# Patient Record
Sex: Female | Born: 1980 | Race: White | Hispanic: No | Smoking: Never smoker
Health system: Southern US, Community
[De-identification: ages and names within clinical notes are randomized; demographics above are authoritative.]

## PROBLEM LIST (undated history)

## (undated) DIAGNOSIS — F32A Depression, unspecified: Secondary | ICD-10-CM

## (undated) DIAGNOSIS — M1990 Unspecified osteoarthritis, unspecified site: Secondary | ICD-10-CM

## (undated) DIAGNOSIS — F329 Major depressive disorder, single episode, unspecified: Secondary | ICD-10-CM

## (undated) DIAGNOSIS — M533 Sacrococcygeal disorders, not elsewhere classified: Secondary | ICD-10-CM

## (undated) DIAGNOSIS — M5136 Other intervertebral disc degeneration, lumbar region: Secondary | ICD-10-CM

## (undated) DIAGNOSIS — J302 Other seasonal allergic rhinitis: Secondary | ICD-10-CM

## (undated) DIAGNOSIS — R32 Unspecified urinary incontinence: Secondary | ICD-10-CM

## (undated) DIAGNOSIS — R9431 Abnormal electrocardiogram [ECG] [EKG]: Secondary | ICD-10-CM

## (undated) DIAGNOSIS — M51369 Other intervertebral disc degeneration, lumbar region without mention of lumbar back pain or lower extremity pain: Secondary | ICD-10-CM

## (undated) DIAGNOSIS — F419 Anxiety disorder, unspecified: Secondary | ICD-10-CM

## (undated) DIAGNOSIS — Z9889 Other specified postprocedural states: Secondary | ICD-10-CM

## (undated) DIAGNOSIS — Z8742 Personal history of other diseases of the female genital tract: Secondary | ICD-10-CM

## (undated) HISTORY — DX: Sacrococcygeal disorders, not elsewhere classified: M53.3

## (undated) HISTORY — DX: Depression, unspecified: F32.A

## (undated) HISTORY — DX: Personal history of other diseases of the female genital tract: Z98.890

## (undated) HISTORY — DX: Unspecified urinary incontinence: R32

## (undated) HISTORY — DX: Other seasonal allergic rhinitis: J30.2

## (undated) HISTORY — DX: Unspecified osteoarthritis, unspecified site: M19.90

## (undated) HISTORY — DX: Major depressive disorder, single episode, unspecified: F32.9

## (undated) HISTORY — DX: Other specified postprocedural states: Z87.42

## (undated) HISTORY — DX: Abnormal electrocardiogram (ECG) (EKG): R94.31

## (undated) HISTORY — DX: Other intervertebral disc degeneration, lumbar region: M51.36

## (undated) HISTORY — DX: Other intervertebral disc degeneration, lumbar region without mention of lumbar back pain or lower extremity pain: M51.369

## (undated) HISTORY — DX: Anxiety disorder, unspecified: F41.9

---

## 1999-01-23 ENCOUNTER — Ambulatory Visit (HOSPITAL_COMMUNITY): Admission: RE | Admit: 1999-01-23 | Discharge: 1999-01-23 | Payer: Self-pay | Admitting: *Deleted

## 1999-01-23 ENCOUNTER — Encounter: Payer: Self-pay | Admitting: *Deleted

## 2001-03-15 HISTORY — PX: OTHER SURGICAL HISTORY: SHX169

## 2001-12-31 ENCOUNTER — Ambulatory Visit (HOSPITAL_COMMUNITY): Admission: RE | Admit: 2001-12-31 | Discharge: 2001-12-31 | Payer: Self-pay | Admitting: Orthopedic Surgery

## 2001-12-31 ENCOUNTER — Encounter: Payer: Self-pay | Admitting: Orthopedic Surgery

## 2002-08-29 ENCOUNTER — Encounter: Admission: RE | Admit: 2002-08-29 | Discharge: 2002-09-18 | Payer: Self-pay | Admitting: Orthopedic Surgery

## 2003-10-14 ENCOUNTER — Ambulatory Visit (HOSPITAL_BASED_OUTPATIENT_CLINIC_OR_DEPARTMENT_OTHER): Admission: RE | Admit: 2003-10-14 | Discharge: 2003-10-14 | Payer: Self-pay | Admitting: Orthopedic Surgery

## 2003-10-30 ENCOUNTER — Encounter: Admission: RE | Admit: 2003-10-30 | Discharge: 2004-01-28 | Payer: Self-pay | Admitting: Orthopedic Surgery

## 2005-03-05 ENCOUNTER — Ambulatory Visit (HOSPITAL_COMMUNITY): Admission: RE | Admit: 2005-03-05 | Discharge: 2005-03-05 | Payer: Self-pay | Admitting: Family Medicine

## 2005-03-09 ENCOUNTER — Ambulatory Visit: Payer: Self-pay | Admitting: Internal Medicine

## 2006-01-11 ENCOUNTER — Emergency Department: Payer: Self-pay | Admitting: Unknown Physician Specialty

## 2006-09-14 ENCOUNTER — Inpatient Hospital Stay: Payer: Self-pay

## 2006-09-20 ENCOUNTER — Ambulatory Visit: Payer: Self-pay

## 2006-11-13 ENCOUNTER — Emergency Department (HOSPITAL_COMMUNITY): Admission: EM | Admit: 2006-11-13 | Discharge: 2006-11-13 | Payer: Self-pay | Admitting: Emergency Medicine

## 2010-07-31 NOTE — Op Note (Signed)
NAME:  [name], [name] [facility]                 ACCOUNT NO.:  [id number]   MEDICAL RECORD NO.:  [id number]                   PATIENT TYPE:  AMB   LOCATION:  DSC                                  FACILITY:  [facility]   PHYSICIAN:  Robert A. Thurston Hole, M.D.              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   PROCEDURES:  1. Left knee examination under anesthesia, followed by arthroscopic     chondroplasty.  2. Left knee lateral release.  3. Left knee partial synovectomy.   SURGEON:  Elana Alm. Thurston Hole, M.D.   ASSISTANT:  Julien Girt, P.A.   ANESTHESIA:  General.   OPERATIVE TIME:  40 minutes.   COMPLICATIONS:  None.   INDICATION FOR PROCEDURE:  Ms. Miranda Morales is a 30 year old woman who has had  significant pain in her left knee for the past two to three years,  increasing in nature, with the exam and MRI documenting chondromalacia,  synovitis, with lateral patellar tracking, who has failed conservative care  and is now to undergo arthroscopy.   DESCRIPTION:  Ms. Miranda Morales was brought to the operating room on October 14, 2003, placed on the operative table in supine position.  After an adequate  level of general anesthesia was obtained, her left knee was examined under  anesthesia.  She had full range of motion and her knee was stable to  ligamentous exam with mild lateral patellar tracking noted.  The knee was  sterilely injected with 0.25% Marcaine with epinephrine.  The left leg was  prepped using sterile Duraprep and draped using sterile technique.  Originally through an anterolateral portal the arthroscope with a pump  attached was placed and through an  anteromedial portal an arthroscopic probe  was placed.  On initial inspection of the medial compartment, she had 25%  grade 3 chondromalacia of the medial femoral condyle, which was debrided.  The medial tibial plateau and the rest of the medial femoral condyle  articular cartilage was normal, medial meniscus normal.  ACL, PCL normal.  Lateral compartment articular cartilage normal.  Lateral meniscus normal.  The patellofemoral joint showed grade 2 and 20% grade 3 chondromalacia,  which was debrided.  The patella showed moderate tightness and lateral  patellar tracking.  Significant synovitis in the medial and lateral gutters  was debrided, otherwise they were free of pathology.  A lateral retinacular  release was performed using an ArthroCare wand under arthroscopic  visualization with no significant bleeding.  This significantly decompressed  the patellofemoral joint and improved patellar tracking to normal.  After  this was done, no further pathology was noted.  At this point it was felt  that all pathology had been satisfactorily  addressed.  The instruments were  removed.  Portals were closed with 3-0 nylon suture and injected with 0.25%  Marcaine with epinephrine and 4 mg of morphine.  A sterile dressing was  applied and the patient was awakened and taken to the recovery room in  stable condition.   FOLLOW-UP CARE:  Ms. Miranda Morales will be followed as an outpatient on Vicodin  and Naprosyn.  See her back in the office in a week for sutures out and  follow-up.                                               Robert A. Thurston Hole, M.D.    RAW/MEDQ  D:  10/14/2003  T:  10/14/2003  Job:  [id number]

## 2015-04-17 ENCOUNTER — Ambulatory Visit: Payer: BLUE CROSS/BLUE SHIELD | Attending: Orthopedic Surgery

## 2015-04-17 DIAGNOSIS — M25652 Stiffness of left hip, not elsewhere classified: Secondary | ICD-10-CM

## 2015-04-17 DIAGNOSIS — R6889 Other general symptoms and signs: Secondary | ICD-10-CM | POA: Diagnosis present

## 2015-04-17 DIAGNOSIS — M25552 Pain in left hip: Secondary | ICD-10-CM | POA: Diagnosis present

## 2015-04-17 NOTE — Patient Instructions (Signed)
Hip Flexor Stretch    Lying on back near edge of bed, bend one leg, foot flat. Hang other leg over edge, relaxed, thigh resting entirely on bed for 20 seconds. Repeat _3___ times. Do __3__ sessions per day. Advanced Exercise: Bend knee back keeping thigh in contact with bed.  http://gt2.exer.us/347   Copyright  VHI. All rights reserved.  Butterfly, Supine    Lie on back, feet together. Lower knees toward floor. Hold _20__ seconds. Repeat _3__ times per session. Do _3__ sessions per day.  Copyright  VHI. All rights reserved.  WALKING  Walking is a great form of exercise to increase your strength, endurance and overall fitness.  A walking program can help you start slowly and gradually build endurance as you go.  Everyone's ability is different, so each person's starting point will be different.  You do not have to follow them exactly.  The are just samples. You should simply find out what's right for you and stick to that program.   In the beginning, you'll start off walking 2-3 times a day for short distances.  As you get stronger, you'll be walking further at just 1-2 times per day.  A. You Can Walk For A Certain Length Of Time Each Day    Walk 5 minutes 3 times per day.  Increase 2 minutes every 2 days (3 times per day).  Work up to 25-30 minutes (1-2 times per day).   Example:   Day 1-2 5 minutes 3 times per day   Day 7-8 12 minutes 2-3 times per day   Day 13-14 25 minutes 1-2 times per day  B. You Can Walk For a Certain Distance Each Day     Distance can be substituted for time.    Example:   3 trips to mailbox (at road)   3 trips to corner of block   3 trips around the block  C. Go to local high school and use the track.    Walk for distance ____ around track  Or time ____ minutes  D. Walk ____ Jog ____ Run ___  Please only do the exercises that your therapist has initialed and dated  [facility] [address] [place], [place]  [address] Phone # [phone] Fax [phone]

## 2015-04-17 NOTE — Therapy (Signed)
[facility] [address] [place], [place], [address] Phone: [phone]   Fax:  [phone]  Physical Therapy Treatment  Patient Details  Name: Miranda Morales MRN: [id number] Date of Birth: 28-Feb-1981 Referring Provider: Salvatore Marvel, MD  Encounter Date: 04/17/2015      PT End of Session - 04/17/15 1307    Visit Number 1   Date for PT Re-Evaluation 06/12/15   PT Start Time 1232   PT Stop Time 1308   PT Time Calculation (min) 36 min   Activity Tolerance Patient tolerated treatment well   Behavior During Therapy [facility] for tasks assessed/performed      History reviewed. No pertinent past medical history.  Past Surgical History  Procedure Laterality Date  . Arthroscopic knee Right 2003    There were no vitals filed for this visit.  Visit Diagnosis:  Hip pain, acute, left  Activity intolerance  Hip stiffness, left      Subjective Assessment - 04/17/15 1232    Subjective Pt presents to PT with Lt hip pain of a chronic nature.  Pt reports that pain began ~3 years ago when she began a more regular exercise program.  Pt reports that pain has been intermittent since that time.  Over the past 8 months pain has become more severe where she cant walk due to pain.     Pertinent History pt reports broken sacrum   Limitations Walking;Standing   How long can you stand comfortably? stabbing pain reported with housework   How long can you walk comfortably? 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 Yes   Pain Score 3   with moving the wrong way. It was severe 2 weeks ago up to 9/10   Pain Location Hip   Pain Orientation Left   Pain Descriptors / Indicators Stabbing   Pain Type Chronic pain   Pain Onset More than a month ago   Pain Frequency Intermittent   Aggravating  Factors  standing, walking, moving the wrong way   Pain Relieving Factors meloxicam, nothing else helps            [facility] PT Assessment - 04/17/15 0001    Assessment   Medical Diagnosis primary localized OA of Lt hip   Referring Provider Salvatore Marvel, MD   Onset Date/Surgical Date 04/16/12   Next MD Visit none   Prior Therapy 3 years ago, nothing recent   Precautions   Precautions None   Restrictions   Weight Bearing Restrictions No   Balance Screen   Has the patient fallen in the past 6 months No   Has the patient had a decrease in activity level because of a fear of falling?  No   Is the patient reluctant to leave their home because of a fear of falling?  No   Home Environment   Living Environment Private residence   Living Arrangements Spouse/significant other;Children   Type of Home House   Home Access --   Prior Function   Level of Independence Independent   Vocation Unemployed   Vocation Requirements Pt has children and cares for the children   Leisure likes to exercise and not able to do low impact exercises   Cognition   Overall Cognitive Status Within Functional Limits for tasks assessed   Observation/Other Assessments   Focus  on Therapeutic Outcomes (FOTO)  59% limitation   ROM / Strength   AROM / PROM / Strength AROM;PROM;Strength   AROM   Overall AROM  Deficits   AROM Assessment Site Hip   Right/Left Hip Right;Left   Right Hip External Rotation  40   Right Hip Internal Rotation  55   Left Hip External Rotation  23   Left Hip Internal Rotation  30   PROM   Overall PROM  Deficits   Overall PROM Comments hamstring length is equal and normal bilaterally.  Lt hip ER limited by 25% vs the Rt with hip pain reported   PROM Assessment Site Hip   Strength   Overall Strength Deficits   Strength Assessment Site Hip;Knee   Right/Left Hip Right;Left   Right Hip Flexion 4+/5   Right Hip Extension 4+/5   Right Hip External Rotation  5/5   Right Hip Internal  Rotation 5/5   Right Hip ABduction 4+/5   Left Hip Flexion 4-/5   Left Hip Extension 4/5   Left Hip External Rotation 4+/5   Left Hip Internal Rotation 4+/5   Left Hip ABduction 4+/5   Right/Left Knee Right;Left   Right Knee Flexion 5/5   Right Knee Extension 5/5   Left Knee Flexion 5/5   Left Knee Extension 4+/5   Palpation   Palpation comment Pt with mild palpable tenderness at the Lt groin and proximal quads.     Ambulation/Gait   Ambulation/Gait Yes   Ambulation Distance (Feet) 100 Feet   Gait Pattern Within Functional Limits                             PT Education - 04/17/15 1301    Education provided Yes   Education Details HEP: hip flexibility and walking program   Person(s) Educated Patient   Methods Explanation;Demonstration;Handout   Comprehension Verbalized understanding;Returned demonstration          PT Short Term Goals - 04/17/15 1313    PT SHORT TERM GOAL #1   Title be independent in initial HEP   Time 4   Period Weeks   Status New   PT SHORT TERM GOAL #2   Title report a 30% reduction in Lt hip pain with community ambulation   Time 4   Period Weeks   Status New   PT SHORT TERM GOAL #3   Title initiate a walking program for exercise and verbalize how to safely progress   Time 4   Period Weeks   Status New           PT Long Term Goals - 04/17/15 1226    PT LONG TERM GOAL #1   Title be independent in advanced HEP   Time 8   Period Weeks   Status New   PT LONG TERM GOAL #2   Title reduce FOTO to < or = to 42% limitation   Time 8   Period Weeks   Status New   PT LONG TERM GOAL #3   Title improve Lt hip ER AROM to allow for crossing leg to put on shoe with 50% increased ease   Time 8   Period Weeks   Status New   PT LONG TERM GOAL #4   Title report a 60% reduction in Lt hip pain with community ambulation   Time 8   Period Weeks   Status New   PT LONG TERM GOAL #5  Title perform walking for exercise regularly  without limitaiton due to Lt hip pain   Time 8   Period Weeks   Status New               Plan - 04/17/15 1308    Clinical Impression Statement Pt reports to PT with 3 year history of Lt hip pain.  Pt reports that this has significantly increased over the past 8 months.  Pt reports that prior to seeing the MD , her hip was 7/10 with walking and she has been unable to exercise.  Pt is now taking Meloxicam and steroid dose pack so her pain is now down to 3/10.  Pt demonstrates limited Lt hip ER and weakness upon strength testing.  Pt is limited in her ability to walk in the community at times due to pain.  Pt will benefit from skilled PT for Lt hip AROM, strength, manual and modalities to allow for improved community ambulation, reduced pain and return to exercise.     Pt will benefit from skilled therapeutic intervention in order to improve on the following deficits Decreased range of motion;Difficulty walking;Decreased endurance;Decreased activity tolerance;Pain;Hypomobility;Decreased strength   Rehab Potential Good   PT Frequency 2x / week   PT Duration 8 weeks   PT Treatment/Interventions ADLs/Self Care Home Management;Cryotherapy;Electrical Stimulation;Moist Heat;Therapeutic exercise;Therapeutic activities;Functional mobility training;Stair training;Gait training;Ultrasound;Patient/family education;Manual techniques;Taping;Dry needling;Passive range of motion   PT Next Visit Plan Lt hip AROM, endurance, PROM, strength, modalities   Consulted and Agree with Plan of Care Patient        Problem List There are no active problems to display for this patient.   TAKACS,KELLY, PT 04/17/2015, 1:16 PM  [facility] [address] [place], [place], [address] Phone: [phone]   Fax:  [phone]  Name: Eyleen Rawlinson MRN: [id number] Date of Birth: 1980-09-16

## 2015-04-23 ENCOUNTER — Ambulatory Visit: Payer: BLUE CROSS/BLUE SHIELD | Admitting: Physical Therapy

## 2015-04-23 ENCOUNTER — Encounter: Payer: Self-pay | Admitting: Physical Therapy

## 2015-04-23 DIAGNOSIS — R6889 Other general symptoms and signs: Secondary | ICD-10-CM

## 2015-04-23 DIAGNOSIS — M25552 Pain in left hip: Secondary | ICD-10-CM | POA: Diagnosis not present

## 2015-04-23 DIAGNOSIS — M25652 Stiffness of left hip, not elsewhere classified: Secondary | ICD-10-CM

## 2015-04-23 NOTE — Patient Instructions (Signed)
Hip Flexion / Knee Extension: Straight-Leg Raise (Eccentric)   Lie on back. Lift leg with knee straight.  Contract your pelvic floor to activate the abdominals. _10__ reps per set, _2__ sets per day,   ABDUCTION: Side-Lying (Active)   Lie on right  side, top leg straight. Raise top leg up.  same reps as above http://gtsc.exer.us/94   (Home) Extension: Hip   With support under abdomen, pelvic floor contraction. Do not hyperextend.  Same as first exercise   ADDUCTION: Side-Lying (Active)   Lie on right side, with top leg bent and in front of other leg. Lift straight lower leg up as high as possible around 2 inches. Have left leg behind since to tight http://gtsc.exer.us/129   Copyright  VHI. All rights reserved.   Piriformis (Supine)  Cross legs, right on top. Gently pull other knee toward chest until stretch is felt in buttock/hip of top leg. Hold __20__ seconds. Repeat  3  times per set. Hold for 20 sec. Do  2-3 sessions per day.  Use a towel or yoga strap   Hip Stretch  Put right ankle over left knee. Let right knee fall downward, but keep ankle in place. Feel the stretch in hip. May push down gently with hand to feel stretch. Hold  20 seconds while counting out loud. Repeat with other leg. Repeat _3 ___ times. Do _2-3.  Stretching: Piriformis   Cross left leg over other thigh and place elbow over outside of knee. Gently stretch buttock muscles by pushing bent knee across body. Hold  20____ seconds. Repeat 3____ times per set.  Do  2-3____ sessions per day.  Stretching: Piriformis (Supine)  Pull right knee toward opposite shoulder. Hold __20__ seconds. Relax. Repeat _3___ times per set.  Do  2-3____ sessions per day.  Copyright  VHI. All rights reserved.

## 2015-04-23 NOTE — Therapy (Signed)
[facility] [address] [place], [place], [address] Phone: [phone]   Fax:  [phone]  Physical Therapy Treatment  Patient Details  Name: Miranda Morales MRN: [id number] Date of Birth: 1980/07/22 Referring Provider: Salvatore Marvel, MD  Encounter Date: 04/23/2015      PT End of Session - 04/23/15 1117    Visit Number 2   Date for PT Re-Evaluation 06/12/15   PT Start Time 1100   PT Stop Time 1145   PT Time Calculation (min) 45 min   Activity Tolerance Patient tolerated treatment well   Behavior During Therapy [facility] for tasks assessed/performed      History reviewed. No pertinent past medical history.  Past Surgical History  Procedure Laterality Date  . Arthroscopic knee Right 2003    There were no vitals filed for this visit.  Visit Diagnosis:  Hip pain, acute, left  Activity intolerance  Hip stiffness, left      Subjective Assessment - 04/23/15 1106    Subjective Pt complains of Lt hip pain of cronic nature. This early morning pain was 4-5/10   Pertinent History pt reports broken sacrum,   Limitations Walking;Standing   How long can you stand comfortably? stabbing pain reported with housework   How long can you walk comfortably? 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 Yes   Pain Score 2   this am up to 4-5/10   Pain Location Hip   Pain Orientation Left   Pain Descriptors / Indicators Stabbing   Pain Type Chronic pain   Pain Onset More than a month ago   Pain Frequency Intermittent   Aggravating Factors  standing,sitting, walking, moving the wrong way   Pain Relieving Factors meloxicam, nothing else helps   Multiple Pain Sites No                         [facility] Adult PT Treatment/Exercise - 04/23/15 0001    Bed Mobility   Bed Mobility --  Eudcated pt on advantage of stationary bike over TM   Exercises   Exercises Knee/Hip;Lumbar   Lumbar Exercises: Stretches   ITB Stretch 3 reps;20 seconds  only left leg   Piriformis Stretch 3 reps;20 seconds  each leg,    Lumbar Exercises: Supine   Straight Leg Raise 20 reps  with TA activation   Lumbar Exercises: Sidelying   Hip Abduction 20 reps  each side difficult on left due to weakness and pain   Other Sidelying Lumbar Exercises 2 x 10 each leg adduction    Lumbar Exercises: Prone   Straight Leg Raise 20 reps  each leg   Knee/Hip Exercises: Aerobic   Stationary Bike L 1 pt tolerated well   Modalities   Modalities Moist Heat   Moist Heat Therapy   Number Minutes Moist Heat 15 Minutes   Moist Heat Location Hip;Lumbar Spine                PT Education - 04/23/15 1147    Education provided Yes   Education Details SLR 4 planes in lying down, piriformis stretch   Person(s) Educated Patient   Methods Explanation;Demonstration;Handout   Comprehension Verbalized understanding;Returned demonstration          PT Short Term Goals - 04/23/15 1125  PT SHORT TERM GOAL #1   Title be independent in initial HEP   Time 4   Period Weeks   Status On-going   PT SHORT TERM GOAL #2   Title report a 30% reduction in Lt hip pain with community ambulation   Time 4   Period Weeks   Status On-going   PT SHORT TERM GOAL #3   Title initiate a walking program for exercise and verbalize how to safely progress   Time 4   Period Weeks   Status On-going           PT Long Term Goals - 04/17/15 1226    PT LONG TERM GOAL #1   Title be independent in advanced HEP   Time 8   Period Weeks   Status New   PT LONG TERM GOAL #2   Title reduce FOTO to < or = to 42% limitation   Time 8   Period Weeks   Status New   PT LONG TERM GOAL #3   Title improve Lt hip ER AROM to allow for crossing leg to put on shoe with 50% increased ease   Time 8   Period  Weeks   Status New   PT LONG TERM GOAL #4   Title report a 60% reduction in Lt hip pain with community ambulation   Time 8   Period Weeks   Status New   PT LONG TERM GOAL #5   Title perform walking for exercise regularly without limitaiton due to Lt hip pain   Time 8   Period Weeks   Status New               Plan - 04/23/15 1118    Clinical Impression Statement Pt is challenged with SLR in supine due to pain and overall weakness in left hip. Pt will benefit from skilled PT to improve strength and increase activity tolerance.    Clinical Impairments Affecting Rehab Potential Per pt reports during birth in 2008 fracture of sacrum, history of Lt hip pain what significantly increased over past 17month.   PT Frequency 2x / week   PT Duration 8 weeks   PT Treatment/Interventions ADLs/Self Care Home Management;Cryotherapy;Electrical Stimulation;Moist Heat;Therapeutic exercise;Therapeutic activities;Functional mobility training;Stair training;Gait training;Ultrasound;Patient/family education;Manual techniques;Taping;Dry needling;Passive range of motion   PT Next Visit Plan Lt hip AROM, endurance, PROM, strength, modalities   Consulted and Agree with Plan of Care Patient        Problem List There are no active problems to display for this patient.   NAUMANN-HOUEGNIFIO,Alexcis Bicking PTA 04/23/2015, 11:58 AM  [facility] [address] [place], [place], [address] Phone: [phone]   Fax:  [phone]  Name: Miranda Morales MRN: [id number] Date of Birth: [birth year]

## 2015-04-24 ENCOUNTER — Ambulatory Visit: Payer: BLUE CROSS/BLUE SHIELD

## 2015-04-24 VITALS — BP 119/78

## 2015-04-24 DIAGNOSIS — M25652 Stiffness of left hip, not elsewhere classified: Secondary | ICD-10-CM

## 2015-04-24 DIAGNOSIS — M25552 Pain in left hip: Secondary | ICD-10-CM | POA: Diagnosis not present

## 2015-04-24 DIAGNOSIS — R6889 Other general symptoms and signs: Secondary | ICD-10-CM

## 2015-04-24 NOTE — Therapy (Signed)
[facility] [address] [place], [place], [address] Phone: [phone]   Fax:  [phone]  Physical Therapy Treatment  Patient Details  Name: Miranda Morales MRN: [id number] Date of Birth: [birth year] Referring Provider: Salvatore Marvel, MD  Encounter Date: 04/24/2015      PT End of Session - 04/24/15 1308    Visit Number 3   Date for PT Re-Evaluation 06/12/15   PT Start Time 1234   PT Stop Time 1315   PT Time Calculation (min) 41 min   Activity Tolerance Patient tolerated treatment well   Behavior During Therapy [facility] for tasks assessed/performed      History reviewed. No pertinent past medical history.  Past Surgical History  Procedure Laterality Date  . Arthroscopic knee Right 2003    Filed Vitals:   04/24/15 1239  BP: 119/78    Visit Diagnosis:  Hip pain, acute, left  Activity intolerance  Hip stiffness, left      Subjective Assessment - 04/24/15 1239    Subjective Pt tolerated yesterday's appointment well.  No increased pain.  No pain today.   Currently in Pain? Yes   Pain Score 0-No pain   Pain Location Hip                         [facility] Adult PT Treatment/Exercise - 04/24/15 0001    Lumbar Exercises: Stretches   Piriformis Stretch 3 reps;20 seconds  seated and supine   Lumbar Exercises: Supine   Straight Leg Raise 20 reps  with TA activation   Other Supine Lumbar Exercises butterfly stretch 3x20 seconds   Lumbar Exercises: Sidelying   Hip Abduction 20 reps  each side difficult on left due to weakness and pain   Other Sidelying Lumbar Exercises 2 x 10 each leg adduction    Lumbar Exercises: Prone   Straight Leg Raise 20 reps  each leg   Knee/Hip Exercises: Stretches   Hip Flexor Stretch Left;2 reps;20 seconds   Knee/Hip Exercises: Aerobic   Stationary Bike L 1 pt tolerated well   Knee/Hip Exercises: Standing   Rebounder weight shifting 3 ways x 1 minute each   Modalities   Modalities Moist Heat   Moist Heat Therapy   Number Minutes Moist Heat 15 Minutes   Moist Heat Location Hip;Lumbar Spine                PT Education - 04/23/15 1147    Education provided Yes   Education Details SLR 4 planes in lying down, piriformis stretch   Person(s) Educated Patient   Methods Explanation;Demonstration;Handout   Comprehension Verbalized understanding;Returned demonstration          PT Short Term Goals - 04/23/15 1125    PT SHORT TERM GOAL #1   Title be independent in initial HEP   Time 4   Period Weeks   Status On-going   PT SHORT TERM GOAL #2   Title report a 30% reduction in Lt hip pain with community ambulation   Time 4   Period Weeks   Status On-going   PT SHORT TERM GOAL #3   Title initiate a walking program for exercise and verbalize how to safely progress   Time 4   Period Weeks   Status On-going           PT Long Term Goals - 04/17/15 1226    PT LONG TERM GOAL #1   Title be independent in  advanced HEP   Time 8   Period Weeks   Status New   PT LONG TERM GOAL #2   Title reduce FOTO to < or = to 42% limitation   Time 8   Period Weeks   Status New   PT LONG TERM GOAL #3   Title improve Lt hip ER AROM to allow for crossing leg to put on shoe with 50% increased ease   Time 8   Period Weeks   Status New   PT LONG TERM GOAL #4   Title report a 60% reduction in Lt hip pain with community ambulation   Time 8   Period Weeks   Status New   PT LONG TERM GOAL #5   Title perform walking for exercise regularly without limitaiton due to Lt hip pain   Time 8   Period Weeks   Status New               Plan - 04/24/15 1240    Clinical Impression Statement Pt is tolerating Lt hip strength well this week.  Pt is able to ride bike in the clinic without increse in pain.  Pt has been walking regularly for "mini walks" up to 10 minutes at home.  Pt without pain today.  Pt with conintued Lt hip weakness and stiffness.   Pt will benefit from skilled PT for hip strength, felxibility and endurance.     Pt will benefit from skilled therapeutic intervention in order to improve on the following deficits Decreased range of motion;Difficulty walking;Decreased endurance;Decreased activity tolerance;Pain;Hypomobility;Decreased strength   Rehab Potential Good   Clinical Impairments Affecting Rehab Potential Per pt reports during birth in 2008 fracture of sacrum, history of Lt hip pain what significantly increased over past 49month.   PT Frequency 2x / week   PT Duration 8 weeks   PT Treatment/Interventions ADLs/Self Care Home Management;Cryotherapy;Electrical Stimulation;Moist Heat;Therapeutic exercise;Therapeutic activities;Functional mobility training;Stair training;Gait training;Ultrasound;Patient/family education;Manual techniques;Taping;Dry needling;Passive range of motion   PT Next Visit Plan Lt hip AROM, endurance, PROM, strength, modalities   Consulted and Agree with Plan of Care Patient        Problem List There are no active problems to display for this patient.   Zanyia Silbaugh, PT 04/24/2015, 1:10 PM  [facility] [address] [place], [place], [address] Phone: [phone]   Fax:  [phone]  Name: Miranda Morales MRN: [id number] Date of Birth: 08/16/1980

## 2015-04-29 ENCOUNTER — Ambulatory Visit: Payer: BLUE CROSS/BLUE SHIELD

## 2015-05-01 ENCOUNTER — Encounter: Payer: BLUE CROSS/BLUE SHIELD | Admitting: Physical Therapy

## 2015-05-02 ENCOUNTER — Ambulatory Visit: Payer: BLUE CROSS/BLUE SHIELD | Admitting: Physical Therapy

## 2015-05-06 ENCOUNTER — Ambulatory Visit: Payer: BLUE CROSS/BLUE SHIELD | Admitting: Physical Therapy

## 2015-05-09 ENCOUNTER — Ambulatory Visit: Payer: BLUE CROSS/BLUE SHIELD | Admitting: Physical Therapy

## 2015-05-09 ENCOUNTER — Encounter: Payer: Self-pay | Admitting: Physical Therapy

## 2015-05-09 DIAGNOSIS — M25552 Pain in left hip: Secondary | ICD-10-CM | POA: Diagnosis not present

## 2015-05-09 DIAGNOSIS — R6889 Other general symptoms and signs: Secondary | ICD-10-CM

## 2015-05-09 DIAGNOSIS — M25652 Stiffness of left hip, not elsewhere classified: Secondary | ICD-10-CM

## 2015-05-09 NOTE — Therapy (Signed)
[facility] [address] [place], [place], [address] Phone: [phone]   Fax:  [phone]  Physical Therapy Treatment  Patient Details  Name: Miranda Morales MRN: [id number] Date of Birth: 02-25-1981 Referring Provider: Salvatore Marvel, MD  Encounter Date: 05/09/2015      PT End of Session - 05/09/15 0955    Visit Number 4   Date for PT Re-Evaluation 06/12/15   PT Start Time 0930   PT Stop Time 1032   PT Time Calculation (min) 62 min   Activity Tolerance Patient tolerated treatment well   Behavior During Therapy [facility] for tasks assessed/performed      History reviewed. No pertinent past medical history.  Past Surgical History  Procedure Laterality Date  . Arthroscopic knee Right 2003    There were no vitals filed for this visit.  Visit Diagnosis:  Hip pain, acute, left  Activity intolerance  Hip stiffness, left      Subjective Assessment - 05/09/15 0942    Subjective No has a laps in PT treatment due to beeing sick with the cold. 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 No c/o of pain in left hip today, but after using a step up stool at home to take down curtains that caused increase of pain in left hip for a full day.    Pertinent History pt reports broken sacrum in 2008, per pt. reports degenerative cyst left hip.   Limitations Walking;Standing   How long can you stand comfortably? stabbing pain reported with housework   How long can you walk comfortably? 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 No/denies   Multiple Pain Sites No                         [facility] Adult PT Treatment/Exercise - 05/09/15 0001    Exercises   Exercises Knee/Hip;Lumbar   Lumbar Exercises: Stretches   Piriformis Stretch 3 reps;20 seconds   seated and supine   Lumbar Exercises: Supine   Other Supine Lumbar Exercises butterfly stretch 3x20 seconds   Lumbar Exercises: Sidelying   Hip Abduction 20 reps;Other (comment)  each side difficult, left due to weakness   Other Sidelying Lumbar Exercises 2 x 10 each leg adduction    Knee/Hip Exercises: Stretches   Hip Flexor Stretch Left;2 reps;20 seconds   Knee/Hip Exercises: Aerobic   Stationary Bike L 1 pt tolerated well   Knee/Hip Exercises: Standing   Rebounder weight shifting 3 ways x 1 minute each   Other Standing Knee Exercises Strengthening/stabilisation of hip  Low lunge with 5# on opposite UE into flexion  2 x 6 each   Modalities   Modalities Moist Heat   Moist Heat Therapy   Number Minutes Moist Heat 15 Minutes   Moist Heat Location Hip;Lumbar Spine                  PT Short Term Goals - 05/09/15 0959    PT SHORT TERM GOAL #1   Title be independent in initial HEP   Time 4   Period Weeks   Status On-going   PT SHORT TERM GOAL #2   Title report a 30% reduction in Lt hip pain with community ambulation   Time 4   Period Weeks  Status On-going   PT SHORT TERM GOAL #3   Title initiate a walking program for exercise and verbalize how to safely progress   Time 4   Period Weeks   Status On-going           PT Long Term Goals - 04/17/15 1226    PT LONG TERM GOAL #1   Title be independent in advanced HEP   Time 8   Period Weeks   Status New   PT LONG TERM GOAL #2   Title reduce FOTO to < or = to 42% limitation   Time 8   Period Weeks   Status New   PT LONG TERM GOAL #3   Title improve Lt hip ER AROM to allow for crossing leg to put on shoe with 50% increased ease   Time 8   Period Weeks   Status New   PT LONG TERM GOAL #4   Title report a 60% reduction in Lt hip pain with community ambulation   Time 8   Period Weeks   Status New   PT LONG TERM GOAL #5   Title perform walking for exercise regularly without limitaiton due to Lt hip  pain   Time 8   Period Weeks   Status New               Plan - 05/09/15 1610    Clinical Impression Statement Pt is tolerating strengthening exercises well. Pt is able to incr the time on the bike up to without incr.of pain, improvement observed with hip abduction. Pt will continue to benefit from skilled PT for hip strength and endurance   Pt will benefit from skilled therapeutic intervention in order to improve on the following deficits Decreased range of motion;Difficulty walking;Decreased endurance;Decreased activity tolerance;Pain;Hypomobility;Decreased strength   Rehab Potential Good   Clinical Impairments Affecting Rehab Potential Per pt reports during birth in 2008 fracture of sacrum, history of Lt hip pain what significantly increased over past 45month.   PT Frequency 2x / week   PT Duration 8 weeks   PT Treatment/Interventions ADLs/Self Care Home Management;Cryotherapy;Electrical Stimulation;Moist Heat;Therapeutic exercise;Therapeutic activities;Functional mobility training;Stair training;Gait training;Ultrasound;Patient/family education;Manual techniques;Taping;Dry needling;Passive range of motion   PT Next Visit Plan Lt hip AROM, endurance, PROM, strength, modalities   Consulted and Agree with Plan of Care Patient        Problem List There are no active problems to display for this patient.   NAUMANN-HOUEGNIFIO,Donelda Mailhot PTA 05/09/2015, 10:19 AM   [facility] [address] [place], [place], [address] Phone: [phone]   Fax:  [phone]  Name: Miranda Morales MRN: [id number] Date of Birth: 08/24/1980

## 2015-05-13 ENCOUNTER — Ambulatory Visit: Payer: BLUE CROSS/BLUE SHIELD

## 2015-05-13 DIAGNOSIS — M25552 Pain in left hip: Secondary | ICD-10-CM | POA: Diagnosis not present

## 2015-05-13 DIAGNOSIS — R6889 Other general symptoms and signs: Secondary | ICD-10-CM

## 2015-05-13 DIAGNOSIS — M25652 Stiffness of left hip, not elsewhere classified: Secondary | ICD-10-CM

## 2015-05-13 NOTE — Therapy (Signed)
[facility] [address] [place], [place], [address] Phone: [phone]   Fax:  [phone]  Physical Therapy Treatment  Patient Details  Name: Miranda Morales MRN: [id number] Date of Birth: October 27, 1980 Referring Provider: Salvatore Marvel, MD  Encounter Date: 05/13/2015      PT End of Session - 05/13/15 1050    Visit Number 5   Date for PT Re-Evaluation 06/12/15   PT Start Time 1018  Pt fatigued after being sick    PT Stop Time 1105   PT Time Calculation (min) 47 min   Activity Tolerance Patient tolerated treatment well   Behavior During Therapy [facility] for tasks assessed/performed      History reviewed. No pertinent past medical history.  Past Surgical History  Procedure Laterality Date  . Arthroscopic knee Right 2003    There were no vitals filed for this visit.  Visit Diagnosis:  Hip pain, acute, left  Activity intolerance  Hip stiffness, left      Subjective Assessment - 05/13/15 1027    Subjective Pt was sick last week.  Still not back to normal yet.     Diagnostic tests arthrogram: Lt hip- pt reports "arthritic cyst", OA, tendonitis   Currently in Pain? No/denies                         [facility] Adult PT Treatment/Exercise - 05/13/15 0001    Lumbar Exercises: Stretches   Piriformis Stretch 3 reps;20 seconds  seated and supine   Lumbar Exercises: Machines for Strengthening   Leg Press 60# bil. 3x10  seat 6   Lumbar Exercises: Supine   Bridge 20 reps;5 seconds  squeezing ball   Straight Leg Raise 20 reps  with TA activation   Other Supine Lumbar Exercises butterfly stretch 3x20 seconds   Lumbar Exercises: Sidelying   Hip Abduction 20 reps;Other (comment)  each side difficult, left due to weakness   Lumbar Exercises: Prone   Straight Leg Raise 20 reps  each leg   Knee/Hip Exercises: Aerobic   Stationary Bike L 2 pt tolerated well   Knee/Hip Exercises: Standing   Rebounder weight shifting 3 ways x 1 minute each   Walking with Sports Cord --   Modalities   Modalities Moist Heat   Moist Heat Therapy   Number Minutes Moist Heat 15 Minutes   Moist Heat Location Hip;Lumbar Spine                  PT Short Term Goals - 05/13/15 1029    PT SHORT TERM GOAL #1   Title be independent in initial HEP   Status Achieved   PT SHORT TERM GOAL #2   Title report a 30% reduction in Lt hip pain with community ambulation   Status Achieved   PT SHORT TERM GOAL #3   Title initiate a walking program for exercise and verbalize how to safely progress   Time 4   Period Weeks   Status On-going  Pt has been sick so hasn't been walking           PT Long Term Goals - 05/13/15 1031    PT LONG TERM GOAL #1   Title be independent in advanced HEP   Time 8   Period Weeks   PT LONG TERM GOAL #3   Title improve Lt hip ER AROM to allow for crossing leg to put on shoe with 50% increased ease  Time 8   Period Weeks   Status On-going               Plan - 05/13/15 1032    Clinical Impression Statement Pt reports 80% overall improvement in Lt hip symptoms with standing and walking since the start of care.  Pt has been sick so has not been really active.  Pt with continued limited Lt hip AROM into ER with crossing the leg.  Pt tolerated all exercise in the clinic without pain or difficulty.     Pt will benefit from skilled therapeutic intervention in order to improve on the following deficits Decreased range of motion;Difficulty walking;Decreased endurance;Decreased activity tolerance;Pain;Hypomobility;Decreased strength   Rehab Potential Good   PT Frequency 2x / week   PT Duration 8 weeks   PT Treatment/Interventions ADLs/Self Care Home Management;Cryotherapy;Electrical Stimulation;Moist Heat;Therapeutic exercise;Therapeutic activities;Functional mobility training;Stair training;Gait training;Ultrasound;Patient/family education;Manual  techniques;Taping;Dry needling;Passive range of motion   PT Next Visit Plan Lt hip AROM, endurance, PROM, strength, modalities.  Try resisted walking next session.   Consulted and Agree with Plan of Care Patient        Problem List There are no active problems to display for this patient.   Yomar Mejorado, PT 05/13/2015, 10:59 AM  [facility] [address] [place], [place], [address] Phone: [phone]   Fax:  [phone]  Name: Lagretta Loseke MRN: [id number] Date of Birth: March 05, 1981

## 2015-05-15 ENCOUNTER — Encounter: Payer: Self-pay | Admitting: Physical Therapy

## 2015-05-15 ENCOUNTER — Ambulatory Visit: Payer: BLUE CROSS/BLUE SHIELD | Attending: Orthopedic Surgery | Admitting: Physical Therapy

## 2015-05-15 DIAGNOSIS — R6889 Other general symptoms and signs: Secondary | ICD-10-CM | POA: Insufficient documentation

## 2015-05-15 DIAGNOSIS — M25652 Stiffness of left hip, not elsewhere classified: Secondary | ICD-10-CM | POA: Insufficient documentation

## 2015-05-15 DIAGNOSIS — M25552 Pain in left hip: Secondary | ICD-10-CM | POA: Diagnosis present

## 2015-05-15 NOTE — Therapy (Signed)
[facility] [address] [place], [place], [address] Phone: [phone]   Fax:  [phone]  Physical Therapy Treatment  Patient Details  Name: Miranda Morales MRN: [id number] Date of Birth: 1981-03-10 Referring Provider: Salvatore Marvel, MD  Encounter Date: 05/15/2015      PT End of Session - 05/15/15 1243    Visit Number 6   Date for PT Re-Evaluation 06/12/15   PT Start Time 1231   PT Stop Time 1326   PT Time Calculation (min) 55 min   Activity Tolerance Patient tolerated treatment well   Behavior During Therapy [facility] for tasks assessed/performed      History reviewed. No pertinent past medical history.  Past Surgical History  Procedure Laterality Date  . Arthroscopic knee Right 2003    There were no vitals filed for this visit.  Visit Diagnosis:  Hip pain, acute, left  Activity intolerance  Hip stiffness, left      Subjective Assessment - 05/15/15 1241    Subjective Pt still recovering from sickness last year. Left hip feels better   Pertinent History pt reports broken sacrum in 2008, per pt. reports degenerative cyst left hip.   Limitations Walking;Standing   How long can you stand comfortably? stabbing pain reported with housework   How long can you walk comfortably? 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 No/denies                         [facility] Adult PT Treatment/Exercise - 05/15/15 0001    Exercises   Exercises Knee/Hip;Lumbar   Lumbar Exercises: Stretches   Piriformis Stretch 3 reps;20 seconds  seated and supine   Lumbar Exercises: Machines for Strengthening   Leg Press 70# bil. 3x10, seat #6   Lumbar Exercises: Supine   Bridge 10 reps;5 seconds  squezzing ball   Straight Leg Raise 10 reps  with TA activation   Other  Supine Lumbar Exercises butterfly stretch 3x20 seconds   Lumbar Exercises: Sidelying   Hip Abduction 20 reps;Other (comment)  each side difficult, left side due to weakness   Lumbar Exercises: Prone   Straight Leg Raise 20 reps  each leg   Knee/Hip Exercises: Aerobic   Stationary Bike L 2 pt tolerated well  Pt was on level 3 for out of the   Knee/Hip Exercises: Standing   Rebounder weight shifting 3 ways x 1 minute each   Walking with Sports Cord 30# 4 directions x 10 each   Modalities   Modalities Moist Heat   Moist Heat Therapy   Number Minutes Moist Heat 15 Minutes   Moist Heat Location Hip;Lumbar Spine                  PT Short Term Goals - 05/13/15 1029    PT SHORT TERM GOAL #1   Title be independent in initial HEP   Status Achieved   PT SHORT TERM GOAL #2   Title report a 30% reduction in Lt hip pain with community ambulation   Status Achieved   PT SHORT TERM GOAL #3   Title initiate a walking program for exercise and verbalize how to safely progress   Time 4   Period Weeks   Status On-going  Pt has been sick so  hasn't been walking           PT Long Term Goals - 05/13/15 1031    PT LONG TERM GOAL #1   Title be independent in advanced HEP   Time 8   Period Weeks   PT LONG TERM GOAL #3   Title improve Lt hip ER AROM to allow for crossing leg to put on shoe with 50% increased ease   Time 8   Period Weeks   Status On-going               Plan - 05/15/15 1244    Clinical Impression Statement Pt has only discomfort with occasionaly wrong movement with left sidestepping. Weakness left hip and limited ROM into ER with crossing the leg. Pt tolerated level 3 on bike for two minutes today. pt will continue to improve with PT for ROM and strength   Pt will benefit from skilled therapeutic intervention in order to improve on the following deficits Decreased range of motion;Difficulty walking;Decreased endurance;Decreased activity  tolerance;Pain;Hypomobility;Decreased strength   Rehab Potential Good   Clinical Impairments Affecting Rehab Potential Per pt reports during birth in 2008 fracture of sacrum, history of Lt hip pain what significantly increased over past 72month.   PT Frequency 2x / week   PT Duration 8 weeks   PT Treatment/Interventions ADLs/Self Care Home Management;Cryotherapy;Electrical Stimulation;Moist Heat;Therapeutic exercise;Therapeutic activities;Functional mobility training;Stair training;Gait training;Ultrasound;Patient/family education;Manual techniques;Taping;Dry needling;Passive range of motion   PT Next Visit Plan Lt hip AROM, endurance, PROM, strength, modalities.  Try resisted walking next session.   Consulted and Agree with Plan of Care Patient        Problem List There are no active problems to display for this patient.   NAUMANN-HOUEGNIFIO,Linard Daft PTA 05/15/2015, 1:16 PM  [facility] [address] [place], [place], [address] Phone: [phone]   Fax:  [phone]  Name: Miranda Morales MRN: [id number] Date of Birth: 09-06-1980

## 2015-05-16 ENCOUNTER — Ambulatory Visit: Payer: BLUE CROSS/BLUE SHIELD | Admitting: Physical Therapy

## 2015-05-19 ENCOUNTER — Ambulatory Visit: Payer: BLUE CROSS/BLUE SHIELD

## 2015-05-19 DIAGNOSIS — M25552 Pain in left hip: Secondary | ICD-10-CM | POA: Diagnosis not present

## 2015-05-19 DIAGNOSIS — M25652 Stiffness of left hip, not elsewhere classified: Secondary | ICD-10-CM

## 2015-05-19 DIAGNOSIS — R6889 Other general symptoms and signs: Secondary | ICD-10-CM

## 2015-05-19 NOTE — Therapy (Signed)
[facility] [address] [place], [place], [address] Phone: [phone]   Fax:  [phone]  Physical Therapy Treatment  Patient Details  Name: Miranda Morales MRN: [id number] Date of Birth: 10-Nov-1980 Referring Provider: Salvatore Marvel, MD  Encounter Date: 05/19/2015      PT End of Session - 05/19/15 1047    Visit Number 7   Date for PT Re-Evaluation 06/12/15   PT Start Time 1016   PT Stop Time 1103  less exercise due to flare-up last session   PT Time Calculation (min) 47 min   Activity Tolerance Patient tolerated treatment well   Behavior During Therapy [facility] for tasks assessed/performed      History reviewed. No pertinent past medical history.  Past Surgical History  Procedure Laterality Date  . Arthroscopic knee Right 2003    There were no vitals filed for this visit.  Visit Diagnosis:  Hip pain, acute, left  Activity intolerance  Hip stiffness, left      Subjective Assessment - 05/19/15 1021    Subjective Pt reports that walking with resistance last session gave her a flare-up of pain.  She feels like pain was back to original pain levels.     Diagnostic tests arthrogram: Lt hip- pt reports "arthritic cyst", OA, tendonitis   Currently in Pain? Yes   Pain Score 1   up to 8/10 on Friday and Saturday   Pain Location Hip   Pain Orientation Left   Pain Type Chronic pain   Pain Onset More than a month ago   Pain Frequency Intermittent   Aggravating Factors  exercise in clinic with sidestepping, standing, walking   Pain Relieving Factors meloxicam                         [facility] Adult PT Treatment/Exercise - 05/19/15 0001    Lumbar Exercises: Stretches   Single Knee to Chest Stretch 3 reps;20 seconds   Piriformis Stretch 3 reps;20 seconds  seated and supine   Lumbar Exercises: Supine   Bridge 10 reps;5 seconds  squezzing ball   Straight Leg Raise 10 reps  with TA activation   Other Supine Lumbar Exercises butterfly stretch 3x20 seconds   Lumbar Exercises: Sidelying   Hip Abduction 20 reps;Other (comment)  each side difficult, left side due to weakness   Lumbar Exercises: Prone   Straight Leg Raise 20 reps  each leg   Knee/Hip Exercises: Aerobic   Stationary Bike Level 2-3  Pt was on level 3 for 4 min out of the   Knee/Hip Exercises: Standing   Rebounder weight shifting 3 ways x 1 minute each   Modalities   Modalities Moist Heat   Moist Heat Therapy   Number Minutes Moist Heat 15 Minutes   Moist Heat Location Hip;Lumbar Spine                  PT Short Term Goals - 05/19/15 1026    PT SHORT TERM GOAL #3   Title initiate a walking program for exercise and verbalize how to safely progress   Time 4   Period Weeks   Status On-going  flare-up over the weekend           PT Long Term Goals - 05/19/15 1026    PT LONG TERM GOAL #1   Title be independent in advanced HEP   Time 8   Period Weeks   Status On-going  PT LONG TERM GOAL #3   Title improve Lt hip ER AROM to allow for crossing leg to put on shoe with 50% increased ease   Time 8   Period Weeks   Status On-going   PT LONG TERM GOAL #4   Title report a 60% reduction in Lt hip pain with community ambulation   Time 8   Period Weeks   Status On-going               Plan - 05/19/15 1027    Clinical Impression Statement Pt with flare-up in pain after last session.  Pt with up to 8/10 Lt hip pain x 2 days.  Pain has now resolved.  Pt with limited Lt hip ER with pain.  Pt increased level on the bike this week.  Pt will plan to contact MD regarding continued pain.  Pt will benefit from skilled PT for Lt hip AROM, strength and pain management.     Pt will benefit from skilled therapeutic intervention in order to improve on the following deficits Decreased range of motion;Difficulty walking;Decreased endurance;Decreased activity tolerance;Pain;Hypomobility;Decreased strength    Rehab Potential Good   Clinical Impairments Affecting Rehab Potential Per pt reports during birth in 2008 fracture of sacrum, history of Lt hip pain what significantly increased over past 88month.   PT Frequency 2x / week   PT Treatment/Interventions ADLs/Self Care Home Management;Cryotherapy;Electrical Stimulation;Moist Heat;Therapeutic exercise;Therapeutic activities;Functional mobility training;Stair training;Gait training;Ultrasound;Patient/family education;Manual techniques;Taping;Dry needling;Passive range of motion   PT Next Visit Plan Lt hip AROM, endurance, PROM, strength, modalities.     Consulted and Agree with Plan of Care Patient        Problem List There are no active problems to display for this patient.   TAKACS,KELLY, PT 05/19/2015, 10:50 AM  [facility] [address] [place], [place], [address] Phone: [phone]   Fax:  [phone]  Name: Miranda Morales MRN: [id number] Date of Birth: 02/04/1981

## 2015-05-22 ENCOUNTER — Ambulatory Visit: Payer: BLUE CROSS/BLUE SHIELD

## 2015-05-28 ENCOUNTER — Ambulatory Visit: Payer: BLUE CROSS/BLUE SHIELD

## 2015-05-28 DIAGNOSIS — R6889 Other general symptoms and signs: Secondary | ICD-10-CM

## 2015-05-28 DIAGNOSIS — M25552 Pain in left hip: Secondary | ICD-10-CM | POA: Diagnosis not present

## 2015-05-28 DIAGNOSIS — M25652 Stiffness of left hip, not elsewhere classified: Secondary | ICD-10-CM

## 2015-05-28 NOTE — Patient Instructions (Signed)
  ABDUCTION: Standing (Active)   Stand, feet flat. Lift right leg out to side. Use _0__ lbs. Complete __10_ repetitions. Perform __2_ sessions per day.    EXTENSION: Standing (Active)  Stand, both feet flat. Draw right leg behind body as far as possible. Use 0___ lbs. Complete 10 repetitions. Perform __2_ sessions per day.  Copyright  VHI. All rights reserved.   Bridge    Lift hips, keeping pelvis level.  Hold 5 seconds Do 2x10__ times, __2_ times per day.  http://ss.exer.us/365   Copyright  VHI. All rights reserved.  [facility] [address], [address] [place], [place] [address] Phone # [phone] Fax [phone]

## 2015-05-28 NOTE — Therapy (Signed)
[facility] [address], [place], [place], [address] Phone: [phone]   Fax:  [phone]  Physical Therapy Treatment  Patient Details  Name: Miranda Morales MRN: [id number] Date of Birth: February 19, 1981 Referring Provider: Elsie Saas, MD  Encounter Date: 05/28/2015      PT End of Session - 05/28/15 1009    Visit Number 8   PT Start Time 0927   PT Stop Time 1011   PT Time Calculation (min) 44 min   Activity Tolerance Patient tolerated treatment well   Behavior During Therapy [facility] for tasks assessed/performed      History reviewed. No pertinent past medical history.  Past Surgical History  Procedure Laterality Date  . Arthroscopic knee Right 2003    There were no vitals filed for this visit.  Visit Diagnosis:  Hip pain, acute, left  Activity intolerance  Hip stiffness, left      Subjective Assessment - 05/28/15 0939    Subjective Pt has not made appt with MD due to financial concerns.  Needs to cancel remaining appts due to financial concerns.  85-90% overall improvement.   Pertinent History pt reports broken sacrum in 2008, per pt. reports degenerative cyst left hip.   Diagnostic tests arthrogram: Lt hip- pt reports "arthritic cyst", OA, tendonitis   Currently in Pain? Yes   Pain Score 2    Pain Location Hip   Pain Orientation Left   Pain Type Chronic pain   Pain Onset More than a month ago   Pain Frequency Intermittent   Aggravating Factors  walking long periods, standing   Pain Relieving Factors medication, rest            [facility] PT Assessment - 05/28/15 0001    Assessment   Medical Diagnosis primary localized OA of Lt hip   Onset Date/Surgical Date 04/16/12   Observation/Other Assessments   Focus on Therapeutic Outcomes (FOTO)  44% limitation   ROM / Strength   AROM / PROM / Strength AROM;Strength   AROM   AROM Assessment Site Hip   Right/Left Hip Right;Left   Left Hip External  Rotation  28   Left Hip Internal Rotation  35   Strength   Overall Strength Deficits   Strength Assessment Site Hip   Left Hip Flexion 4/5   Left Hip Extension 4+/5   Left Hip External Rotation 4+/5   Left Hip Internal Rotation 4+/5   Left Hip ABduction 4+/5                     [facility] Adult PT Treatment/Exercise - 05/28/15 0001    Knee/Hip Exercises: Stretches   Piriformis Stretch 3 reps;20 seconds   Knee/Hip Exercises: Aerobic   Stationary Bike 37mn Level 2-3  Pt was on level 3 for 4 min out of the 861m   Elliptical Ramp 5, Level 2x 5 minutes   Knee/Hip Exercises: Standing   Hip Abduction Stengthening;Both;2 sets;10 reps   Hip Extension Stengthening;Both;2 sets;10 reps   Knee/Hip Exercises: Supine   Bridges with BaDiona Foleyqueeze Strengthening;Both;2 sets;10 reps   Straight Leg Raises Strengthening;2 sets;10 reps;Both  4 ways (abduction, extension, adduction)                PT Education - 05/28/15 0958    Education provided Yes   Education Details standing hip abduction and extension, bridge   Person(s) Educated Patient   Methods Explanation;Demonstration;Handout   Comprehension Verbalized understanding;Returned demonstration  PT Short Term Goals - 05/19/15 1026    PT SHORT TERM GOAL #3   Title initiate a walking program for exercise and verbalize how to safely progress   Time 4   Period Weeks   Status On-going  flare-up over the weekend           PT Long Term Goals - 05/28/15 0942    PT LONG TERM GOAL #1   Title be independent in advanced HEP   Status Achieved   PT LONG TERM GOAL #2   Title reduce FOTO to < or = to 42% limitation   Status Partially Met  44% limitation   PT LONG TERM GOAL #3   Title improve Lt hip ER AROM to allow for crossing leg to put on shoe with 50% increased ease   Status Not Met   PT LONG TERM GOAL #4   Title report a 60% reduction in Lt hip pain with community ambulation   Status Achieved   PT LONG  TERM GOAL #5   Title perform walking for exercise regularly without limitaiton due to Lt hip pain   Status Partially Met  pain and antalgia with long periods of walking               Plan - 05/28/15 1007    Clinical Impression Statement Pt reports 85-90% overall improvement in Lt hip pain since the start of care.  Pt demonstrates 5 degree improvements in Lt hip IR/ER and strength improvements since the start of care.  Pt has comprehensive HEP and gym exercises in place and will continue with this prorgram for continued gains.  Pt will follow-up with MD as needed to discuss limited AROM and and pain with walking.     PT Next Visit Plan D/C PT to HEP   Consulted and Agree with Plan of Care Patient        Problem List There are no active problems to display for this patient. PHYSICAL THERAPY DISCHARGE SUMMARY  Visits from Start of Care: 8  Current functional level related to goals / functional outcomes: See above for current status.     Remaining deficits: Limited Lt knee AROM into IR/ER that limits putting on shoes.  Pt also with Lt hip pain that limits endurance tasks at times.  Pt has HEP in place for continued gains.     Education / Equipment: HEP Plan: Patient agrees to discharge.  Patient goals were partially met. Patient is being discharged due to being pleased with the current functional level.  ?????     Sigurd Sos, PT 05/28/2015 10:12 AM  [facility] [address], [place], [place], [address] Phone: [phone]   Fax:  [phone]  Name: Miranda Morales MRN: [id number] Date of Birth: August 27, 1980

## 2015-06-05 ENCOUNTER — Encounter: Payer: BLUE CROSS/BLUE SHIELD | Admitting: Physical Therapy

## 2015-07-10 DIAGNOSIS — F330 Major depressive disorder, recurrent, mild: Secondary | ICD-10-CM | POA: Insufficient documentation

## 2015-07-25 ENCOUNTER — Telehealth: Payer: Self-pay | Admitting: Obstetrics and Gynecology

## 2015-07-25 NOTE — Telephone Encounter (Signed)
Called and left a message for patient to call back to schedule a new patient doctor referral. °

## 2015-08-01 ENCOUNTER — Ambulatory Visit (INDEPENDENT_AMBULATORY_CARE_PROVIDER_SITE_OTHER): Payer: BLUE CROSS/BLUE SHIELD | Admitting: Obstetrics and Gynecology

## 2015-08-01 ENCOUNTER — Encounter: Payer: Self-pay | Admitting: Obstetrics and Gynecology

## 2015-08-01 ENCOUNTER — Other Ambulatory Visit: Payer: Self-pay | Admitting: Obstetrics and Gynecology

## 2015-08-01 VITALS — BP 128/76 | HR 70 | Resp 14 | Ht 66.5 in | Wt 211.8 lb

## 2015-08-01 DIAGNOSIS — N841 Polyp of cervix uteri: Secondary | ICD-10-CM | POA: Diagnosis not present

## 2015-08-01 DIAGNOSIS — IMO0002 Reserved for concepts with insufficient information to code with codable children: Secondary | ICD-10-CM

## 2015-08-01 DIAGNOSIS — N930 Postcoital and contact bleeding: Secondary | ICD-10-CM | POA: Diagnosis not present

## 2015-08-01 DIAGNOSIS — IMO0001 Reserved for inherently not codable concepts without codable children: Secondary | ICD-10-CM

## 2015-08-01 DIAGNOSIS — N6452 Nipple discharge: Secondary | ICD-10-CM

## 2015-08-01 DIAGNOSIS — R32 Unspecified urinary incontinence: Secondary | ICD-10-CM | POA: Diagnosis not present

## 2015-08-01 DIAGNOSIS — N8110 Cystocele, unspecified: Secondary | ICD-10-CM

## 2015-08-01 DIAGNOSIS — F526 Dyspareunia not due to a substance or known physiological condition: Secondary | ICD-10-CM

## 2015-08-01 DIAGNOSIS — N946 Dysmenorrhea, unspecified: Secondary | ICD-10-CM | POA: Diagnosis not present

## 2015-08-01 DIAGNOSIS — N816 Rectocele: Secondary | ICD-10-CM

## 2015-08-01 DIAGNOSIS — L680 Hirsutism: Secondary | ICD-10-CM | POA: Diagnosis not present

## 2015-08-01 LAB — POCT URINALYSIS DIPSTICK
Bilirubin, UA: NEGATIVE
Glucose, UA: NEGATIVE
KETONES UA: NEGATIVE
Leukocytes, UA: NEGATIVE
Nitrite, UA: NEGATIVE
PH UA: 5
Protein, UA: NEGATIVE
RBC UA: NEGATIVE
UROBILINOGEN UA: NEGATIVE

## 2015-08-01 LAB — LIPID PANEL
CHOL/HDL RATIO: 3 ratio (ref <=5.0)
Cholesterol: 163 mg/dL (ref 125–200)
HDL: 55 mg/dL (ref >=46)
LDL CALC: 90 mg/dL (ref <130)
Triglycerides: 89 mg/dL (ref <150)
VLDL: 18 mg/dL (ref <30)

## 2015-08-01 LAB — COMPREHENSIVE METABOLIC PANEL
ALBUMIN: 4.4 g/dL (ref 3.6–5.1)
ALK PHOS: 59 U/L (ref 33–115)
ALT: 18 U/L (ref 6–29)
AST: 16 U/L (ref 10–30)
BUN: 9 mg/dL (ref 7–25)
CALCIUM: 9.3 mg/dL (ref 8.6–10.2)
CHLORIDE: 103 mmol/L (ref 98–110)
CO2: 26 mmol/L (ref 20–31)
Creat: 0.58 mg/dL (ref 0.50–1.10)
Glucose, Bld: 82 mg/dL (ref 65–99)
POTASSIUM: 4 mmol/L (ref 3.5–5.3)
Sodium: 140 mmol/L (ref 135–146)
TOTAL PROTEIN: 6.6 g/dL (ref 6.1–8.1)
Total Bilirubin: 0.3 mg/dL (ref 0.2–1.2)

## 2015-08-01 NOTE — Progress Notes (Signed)
Patient ID: Miranda Morales, female   DOB: [birth year], 35 y.o.   MRN: [id number]  35 y.o. G33P2002 Married Caucasian female here for evaluation of possible pelvic prolapse, urinary incontinence.    Since birth of second child six years ago, has had progressive problems.  Having urinary incontinence daily and needing to use protection and leaking through.  Sometimes leaking with exercise a little bit.  No clear urinary leakage with cough or laugh.  Has random leakage.  Voids well, but feels like she leaks after she voids. No urgency.  Leaks in the shower and after washing her hands. DF - every 30 minutes. NF - none. Drinks 4 caffeines per day on average plus sodas. Denies dysuria.  UTIs in the last year - maybe one.  No pyelonephritis.  No stones.   One episode of fecal soiling in pregnancy and once with illness.  Some cramps with bowel movements.  Has been doing perianal splinting twice a month for a long time per patient.  Not using stool softeners.   Tampons are painful and they will push out.  Feels like something is not right in the vaginal area.  May have a skin tag.  Having severe menstrual cramping.  Menses are regular in general but can occur every 24 days - 28 days.   Bleeding after intercourse, and pain after intercourse lasting for 1.5 days.  Thinks she has a cervical polyp. Notes a lot of white discharge also for months.   Also having right sided pain and is concerned about recurrent ovarian cysts. On OCPs in the past.  Not thinking about future childbearing.  Declines Mirena which she had in the past.   Worried about weight gain and facial hair.  Shaves her face.  Some milky discharge from the bilateral breasts.  Also occurs with stimulation. Having green- black discharge from the breasts bilaterally.   Patient just had thyroid testing which was normal.   PCP:  Georgette Shell, PA-C   Patient's last menstrual period was 07/24/2015 (exact date).     Period Cycle  (Days): 30 (occ. 24-27 days) Period Duration (Days): 7 Period Pattern: Regular Menstrual Flow: Moderate Menstrual Control: Maxi pad Menstrual Control Change Freq (Hours): every 4-6 hours Dysmenorrhea: (!) Severe Dysmenorrhea Symptoms: Cramping, Nausea, Diarrhea     Sexually active: Yes.    The current method of family planning is condoms most of the time.    Exercising: Yes.    walking and elliptical. Smoker:  no  Health Maintenance: Pap:  3-4 years ago, normal per patient History of abnormal Pap:  no MMG:  n/a Colonoscopy:  n/a BMD:   n/a  Result  n/a TDaP:  07/2015 Gardasil:   no   Urine:  Negative.     reports that she has never smoked. She does not have any smokeless tobacco history on file. She reports that she drinks about 4.2 oz of alcohol per week. She reports that she does not use illicit drugs.  Past Medical History  Diagnosis Date  . Anxiety   . Depression   . Urinary incontinence   . Seasonal allergies   . Coccyx pain     --?broken during childbirth  . H/O cervical polypectomy     Past Surgical History  Procedure Laterality Date  . Arthroscopic knee Right 2003    Current Outpatient Prescriptions  Medication Sig Dispense Refill  . fexofenadine (ALLEGRA) 180 MG tablet Take 1 tablet by mouth daily.    Marland Kitchen FLUoxetine (PROZAC) 10 MG capsule  Take 1 capsule by mouth 2 (two) times daily.    . fluticasone (FLONASE) 50 MCG/ACT nasal spray Place 1 spray into the nose daily.    . meloxicam (MOBIC) 15 MG tablet Take 1 tablet by mouth as needed.  3  . Multiple Vitamin (MULTIVITAMIN) capsule Take 1 capsule by mouth daily.    . Turmeric 450 MG CAPS Take 1 tablet by mouth daily.     No current facility-administered medications for this visit.    Family History  Problem Relation Age of Onset  . Breast cancer Maternal Grandmother 51    dec from small bowel obstruction  . Thyroid disease Mother     hypothyroidism  . Hypertension Father   . Hypertension Paternal  Grandfather     ROS:  Pertinent items are noted in HPI.  Otherwise, a comprehensive ROS was negative.  Exam:   BP 128/76 mmHg  Pulse 70  Resp 14  Ht 5' 6.5" (1.689 m)  Wt 211 lb 12.8 oz (96.072 kg)  BMI 33.68 kg/m2  LMP 07/24/2015 (Exact Date)    General appearance: alert, cooperative and appears stated age Head: Normocephalic, without obvious abnormality, atraumatic Neck: no adenopathy, supple, symmetrical, trachea midline and thyroid normal to inspection and palpation Lungs: clear to auscultation bilaterally Breasts: normal appearance, no masses or tenderness, Inspection negative, No nipple retraction or dimpling, No nipple discharge or bleeding, No axillary or supraclavicular adenopathy of the right breast. Left nipple with clear discharge.  No masses, retraction, or axillary adenopathy. Heart: regular rate and rhythm Abdomen: incisions:  No.    , soft, non-tender; no masses, no organomegaly Extremities: extremities normal, atraumatic, no cyanosis or edema Skin: Skin color, texture, turgor normal. No rashes or lesions Lymph nodes: Cervical, supraclavicular, and axillary nodes normal. No abnormal inguinal nodes palpated Neurologic: Grossly normal  Pelvic: External genitalia:  no lesions              Urethra:  normal appearing urethra with no masses, tenderness or lesions              Bartholins and Skenes: normal                 Vagina: normal appearing vagina with normal color and discharge, no lesions.  First - second degree cystocele and first to second degree rectocele.               Cervix:  Cervical polyp removed with ring forceps and sent to pathology with patient's permission.              Pap taken: No. Bimanual Exam:  Uterus:  normal size, contour, position, consistency, mobility, non-tender              Adnexa: normal adnexa and no mass, fullness, tenderness              Rectal exam: Yes.  .  Confirms.              Anus:  normal sphincter tone, no lesions  Chaperone  was present for exam.  Assessment:   Hirsutism.  Dyspareunia.  Pelvic pain.  Cystocele. Rectocele.  Overactive bladder.  Dysmenorrhea.  Post coital bleeding.  Cervical polyp. Bilateral nipple discharge that is dark per patient.  Clear left nipple discharge noted by me today.   Plan:    Labs - Testosterone, DHEAS, 17 OH-P, prolactin, CMP, lipid profile.  Discussion of possible PCOS and tx options.  Discussed hirsutism and use of OCPs and spironolactone to  reduce future hair growth and electrolysis or laser for current terminal hair growth.  No Rx today.  Cervical polyp to pathology.  Comprehensive discussion regarding pelvic organ prolapse - etiologies and tx options - observation, pelvic PT, Impressa, pessary, and surgical care.  Discussion regarding overactive bladder.  I recommend reduction of caffeine use and bladder irritants.  Discussed medication if symptoms persist.  Pelvic PT can also be helpful. ACOG handouts to patient on prolapse and incontinence.  Return for pelvic ultrasound to evaluate pelvic pain, dyspareunia, and dysmenorrhea.  Will schedule bilateral diagnostic mammogram and ductograms. Explained that pap will be done at a different date.  Cannot do today due to cervical polyp removal and bleeding.     After visit summary provided.   ___45____ minutes face to face time of which over 50% was spent in counseling.  I clarified with the patient at the beginning of the visit that her visit was really a problem visit and not a routine annual exam and she presented with multiple problems for which she requested care.  I explained that all of her concerns will be addressed and that it will require more than one office visit to evaluate and proceed with treatment options.

## 2015-08-02 LAB — PROLACTIN: Prolactin: 7 ng/mL

## 2015-08-02 LAB — DHEA-SULFATE: DHEA-SO4: 268 ug/dL — ABNORMAL HIGH (ref 23–266)

## 2015-08-03 LAB — TESTOSTERONE, TOTAL, LC/MS/MS: TESTOSTERONE, TOTAL, LC-MS-MS: 18 ng/dL (ref 2–45)

## 2015-08-04 ENCOUNTER — Telehealth: Payer: Self-pay | Admitting: Obstetrics and Gynecology

## 2015-08-04 NOTE — Telephone Encounter (Signed)
I agree with proceeding with the ultrasound this week.  Ibuprofen 800 mg po q 8 hours prn pain OR she can take her Mobic 15 mg once daily for cramping.  Eat food prior to taking medication.   I am waiting for all labs to be back.  Hopefully, everything will be ready at the time of her ultrasound appointment.

## 2015-08-04 NOTE — Telephone Encounter (Signed)
Spoke with patient. Advised of message as seen below from Dr.Silva. She is agreeable and verbalizes understanding.  Routing to provider for final review. Patient agreeable to disposition. Will close encounter.  

## 2015-08-04 NOTE — Telephone Encounter (Signed)
Spoke with patient to convey ultrasound benefits.  Patient states had polyps removed last week and is now experiencing bleeding and discharge. Patient wants to know if she is still experiencing the bleeding on the date of her appointment, will she be able to proceed with the ultrasound. Routing to triage for reveiw

## 2015-08-04 NOTE — Telephone Encounter (Signed)
Spoke with patient. Patient had a polyp removed on 08/01/2015. States that right after the removal she had light bright red spotting. Over the weekend this turned into "brown clumpy bleeding and discharge like when I have my period." States last night she began to have increased cramping and stomach discomfort. Denies any stomach discomfort today. "I am mainly having cramping today. I have been having discomfort since before the removal though." Patient is asking if is it okay to proceed with PUS on 08/07/2015. Advised she may proceed with PUS as schedule. Advised if bleeding increases, discomfort increases, develops pain, fever, or chills she will need to be seen in the office for further evaluation before her PUS. She is agreeable. Advised I will send a message to Dr.Silva and return call with any additional recommendations. She is agreeable.

## 2015-08-05 LAB — IPS OTHER TISSUE BIOPSY

## 2015-08-05 LAB — FSH/LH
FSH: 4.2 m[IU]/mL
LH: 4.7 m[IU]/mL

## 2015-08-06 ENCOUNTER — Ambulatory Visit
Admission: RE | Admit: 2015-08-06 | Discharge: 2015-08-06 | Disposition: A | Discharge status: Home / Self Care | Payer: BLUE CROSS/BLUE SHIELD | Source: Ambulatory Visit | Attending: Obstetrics and Gynecology | Admitting: Obstetrics and Gynecology

## 2015-08-06 DIAGNOSIS — N6452 Nipple discharge: Secondary | ICD-10-CM

## 2015-08-06 LAB — 17-HYDROXYPROGESTERONE: 17-OH-Progesterone, LC/MS/MS: 12 ng/dL

## 2015-08-07 ENCOUNTER — Ambulatory Visit (INDEPENDENT_AMBULATORY_CARE_PROVIDER_SITE_OTHER): Payer: BLUE CROSS/BLUE SHIELD | Admitting: Obstetrics and Gynecology

## 2015-08-07 ENCOUNTER — Encounter: Payer: Self-pay | Admitting: Obstetrics and Gynecology

## 2015-08-07 ENCOUNTER — Ambulatory Visit (INDEPENDENT_AMBULATORY_CARE_PROVIDER_SITE_OTHER): Payer: BLUE CROSS/BLUE SHIELD

## 2015-08-07 VITALS — BP 100/70 | HR 80 | Ht 66.5 in | Wt 211.0 lb

## 2015-08-07 DIAGNOSIS — N8110 Cystocele, unspecified: Secondary | ICD-10-CM | POA: Diagnosis not present

## 2015-08-07 DIAGNOSIS — F526 Dyspareunia not due to a substance or known physiological condition: Secondary | ICD-10-CM

## 2015-08-07 DIAGNOSIS — IMO0001 Reserved for inherently not codable concepts without codable children: Secondary | ICD-10-CM

## 2015-08-07 DIAGNOSIS — N946 Dysmenorrhea, unspecified: Secondary | ICD-10-CM | POA: Diagnosis not present

## 2015-08-07 DIAGNOSIS — IMO0002 Reserved for concepts with insufficient information to code with codable children: Secondary | ICD-10-CM

## 2015-08-07 DIAGNOSIS — M25559 Pain in unspecified hip: Secondary | ICD-10-CM | POA: Diagnosis not present

## 2015-08-07 DIAGNOSIS — N816 Rectocele: Secondary | ICD-10-CM

## 2015-08-07 DIAGNOSIS — L680 Hirsutism: Secondary | ICD-10-CM

## 2015-08-07 DIAGNOSIS — N3281 Overactive bladder: Secondary | ICD-10-CM

## 2015-08-07 MED ORDER — NORETHINDRONE ACET-ETHINYL EST 1.5-30 MG-MCG PO TABS: 1.0000 | ORAL_TABLET | Freq: Every day | ORAL | Status: DC

## 2015-08-07 NOTE — Progress Notes (Signed)
Subjective  35 y.o. 1102P2002 Married Caucasian female here for pelvic ultrasound for pelvic pain, dysmenorrhea, and dyspareunia. Also here for follow up on hirsutism, cystocele, rectocele, urinary incontinence, bilateral nipple discharge, and cervical polyp removal.  Has right sided pain with bowel movement today.  Hx ovarian cysts. Some difficulty having BMs.  ?Fecal incontinence.   Has urinary frequency, random leakage, and leakage of urine with washing hands or in the shower. Drinks 4 caffeine beverages per day plus sodas.  Labs drawn at visit on 08/01/15 showing DHEAS 268 (266 is upper limit of normal), otherwise normal 17 OH-P, prolactin, LH. FSH, testosterone.  TFTs normal with PCP.  Cervical polyp removed on 08/01/15 was benign.   Bilateral mammogram 08/06/15 - benign breast evaluation. BI-RADS2.  Discharge consistent with physiologic discharge or fibrocystic change.  No ductogram performed.  Patient's last menstrual period was 07/24/2015 (exact date).  Objective  Pelvic ultrasound images and report reviewed with patient.  Uterus - no masses. EMS - 12.61 mm. Ovaries - right ovary with 35 x 33 mm simple cyst.  Left ovary normal.  Free fluid - no      Assessment  1.  Hirsuitism.  Removing hair from face.  2.  Pelvic pain. 3.  Some pain with BMs.  4.  Prolapse.  Thinks she may be having fecal incontinence. 5.  Overactive bladder.  Plan  1.  LoEstrin 1/30 x 3 months. Discussed contraceptive and noncontraceptive benefits.  Reviewed side effects of DVT, PE, MI, stroke.  Has taken in the past without problem and does not have contraindications.  2.  Kegels.  3.  Will consider pelvic floor PT and surgery to treat cystocele and rectocele. 4.  Metamucil.  Gradually increase usage. 5.  Reduce caffeine. 6.  Can consider anticholinergic or antimuscarinic but would recommend reduction of bladder irritants first. 7.  Recheck and pap in 3 months.   ___25___ minutes face to face  time of which over 50% was spent in counseling.   After visit summary to patient.

## 2015-08-10 ENCOUNTER — Encounter: Payer: Self-pay | Admitting: Obstetrics and Gynecology

## 2015-11-07 ENCOUNTER — Ambulatory Visit: Payer: BLUE CROSS/BLUE SHIELD | Admitting: Obstetrics and Gynecology

## 2015-11-13 ENCOUNTER — Other Ambulatory Visit: Payer: Self-pay | Admitting: Obstetrics and Gynecology

## 2015-11-13 NOTE — Telephone Encounter (Signed)
Medication refill request: Norethindrone Acetate-Ethinyl Estradiol Last AEX:  08/01/15 BS Next AEX: 11/28/15 BS Last MMG (if hormonal medication request): 08/06/15 BIRADS2; Bilateral Dx U/S BIRADS2 Refill authorized: 08/07/15 #1 Package 2R. Please advise. Thank you.   Routing to SM since BS is out of the office.

## 2015-11-28 ENCOUNTER — Ambulatory Visit (INDEPENDENT_AMBULATORY_CARE_PROVIDER_SITE_OTHER): Payer: Medicaid Other | Admitting: Obstetrics and Gynecology

## 2015-11-28 VITALS — BP 120/80 | HR 64 | Resp 16 | Ht 66.5 in | Wt 208.2 lb

## 2015-11-28 DIAGNOSIS — N841 Polyp of cervix uteri: Secondary | ICD-10-CM

## 2015-11-28 DIAGNOSIS — Z124 Encounter for screening for malignant neoplasm of cervix: Secondary | ICD-10-CM

## 2015-11-28 DIAGNOSIS — M25559 Pain in unspecified hip: Secondary | ICD-10-CM | POA: Diagnosis not present

## 2015-11-28 DIAGNOSIS — Z113 Encounter for screening for infections with a predominantly sexual mode of transmission: Secondary | ICD-10-CM

## 2015-11-28 MED ORDER — NORETHINDRONE ACET-ETHINYL EST 1.5-30 MG-MCG PO TABS: 1.0000 | ORAL_TABLET | Freq: Every day | ORAL | 11 refills | Status: DC

## 2015-11-28 NOTE — Progress Notes (Signed)
GYNECOLOGY  VISIT   HPI: 35 y.o.   Married  Caucasian  female   G2P2002 with No LMP recorded.   here for 3 month follow-up of OCP's and pap smear.     Took OCPs for 2 months before she had a cycle.  Menses lighter and shorter than normal.  Wants to continue.  Now does not have daily pain.   Has overactive bladder symptoms but this is stable.  Drinks several coffees per day. Reducing caffeine use.   Having constipation and fecal incontinence issues.  Marital strife. Husband is in rehab.  Patient not sexually active.  She is worried about STDs.  GYNECOLOGIC HISTORY: No LMP recorded. Contraception:  OCP's Menopausal hormone therapy:  None Last mammogram:  08/06/15 [facility]IRADS2, D[facility]; 08/06/15 [facility] Bilateral BIRADS2, Density B Last pap smear:  3-4 years ago, normal per patient        OB History    Gravida Para Term Preterm AB Living   2 2 2     2    SAB TAB Ectopic Multiple Live Births                     There are no active problems to display for this patient.   Past Medical History:  Diagnosis Date  . Anxiety   . Coccyx pain    --?broken during childbirth  . Depression   . H/O cervical polypectomy   . Seasonal allergies   . Urinary incontinence     Past Surgical History:  Procedure Laterality Date  . arthroscopic knee Right 2003    Current Outpatient Prescriptions  Medication Sig Dispense Refill  . fexofenadine (ALLEGRA) 180 MG tablet Take 1 tablet by mouth daily.    Marland Kitchen. FLUoxetine (PROZAC) 10 MG capsule Take 1 capsule by mouth 2 (two) times daily.    . fluticasone (FLONASE) 50 MCG/ACT nasal spray Place 1 spray into the nose daily.    Colleen Can. JUNEL 1.5/30 1.5-30 MG-MCG tablet TAKE 1 TABLET BY MOUTH EVERY DAY 21 tablet 2  . meloxicam (MOBIC) 15 MG tablet Take 1 tablet by mouth as needed.  3  . Multiple Vitamin (MULTIVITAMIN) capsule Take 1 capsule by mouth daily.    . Turmeric 450 MG CAPS Take 1 tablet by mouth daily.     No current  facility-administered medications for this visit.      ALLERGIES: Review of patient's allergies indicates no known allergies.  Family History  Problem Relation Age of Onset  . Breast cancer Maternal Grandmother 475    dec from small bowel obstruction  . Thyroid disease Mother     hypothyroidism  . Hypertension Father   . Hypertension Paternal Grandfather     Social History   Social History  . Marital status: Married    Spouse name: N/A  . Number of children: N/A  . Years of education: N/A   Occupational History  . Not on file.   Social History Main Topics  . Smoking status: Never Smoker  . Smokeless tobacco: Not on file  . Alcohol use 4.2 oz/week    7 Standard drinks or equivalent per week     Comment: 1 glass of wine/night  . Drug use: No  . Sexual activity: Yes    Partners: Male    Birth control/ protection: Condom   Other Topics Concern  . Not on file   Social History Narrative  . No narrative on file    ROS:  Pertinent items  are noted in HPI.  PHYSICAL EXAMINATION:    There were no vitals taken for this visit.    General appearance: alert, cooperative and appears stated age   Pelvic: External genitalia:  no lesions              Urethra:  normal appearing urethra with no masses, tenderness or lesions              Bartholins and Skenes: normal                 Vagina: normal appearing vagina with normal color and discharge, no lesions              Cervix: polyp noted and removed with ring forceps and kelly.  Sent to pathology.                Bimanual Exam:  Uterus:  normal size, contour, position, consistency, mobility, non-tender              Adnexa: no mass, fullness, tenderness              Pap taken.  Chaperone was present for exam.  ASSESSMENT  Pelvic pain.  Improved on combined OCPs. Overactive bladder.  Heavy caffeine use.  Concern for STDs. Cervical polyp. Constipation and diarrhea.   PLAN  Continue OCPs.  Refill given for one year.   Pap and HR HPV taken.  GC/CT from pap.  Polyp to pathology. Will return for blood work to complete STD screening.  Reduce caffeine use.  I would recommend this prior to starting medication for overactive bladder which are not so likely to work in this circumstance as the frequency is appropriate given the dietary diuretic use. Start Metamucil to help with bowel function.  Increase physical activity.  Follow up prn and for annual exam.    An After Visit Summary was printed and given to the patient.  __15____ minutes face to face time of which over 50% was spent in counseling.

## 2015-11-30 ENCOUNTER — Encounter: Payer: Self-pay | Admitting: Obstetrics and Gynecology

## 2015-12-02 LAB — IPS PAP TEST WITH HPV

## 2015-12-02 LAB — IPS OTHER TISSUE BIOPSY

## 2015-12-03 LAB — IPS N GONORRHOEA AND CHLAMYDIA BY PCR

## 2016-02-08 ENCOUNTER — Other Ambulatory Visit: Payer: Self-pay | Admitting: Obstetrics & Gynecology

## 2016-03-19 ENCOUNTER — Emergency Department (HOSPITAL_COMMUNITY)
Admission: EM | Admit: 2016-03-19 | Discharge: 2016-03-19 | Disposition: A | Discharge status: Home / Self Care | Payer: Medicaid Other | Attending: Emergency Medicine | Admitting: Emergency Medicine

## 2016-03-19 ENCOUNTER — Encounter (HOSPITAL_COMMUNITY): Payer: Self-pay

## 2016-03-19 ENCOUNTER — Emergency Department (HOSPITAL_COMMUNITY): Payer: Medicaid Other

## 2016-03-19 DIAGNOSIS — K047 Periapical abscess without sinus: Secondary | ICD-10-CM | POA: Diagnosis not present

## 2016-03-19 DIAGNOSIS — K0889 Other specified disorders of teeth and supporting structures: Secondary | ICD-10-CM | POA: Diagnosis present

## 2016-03-19 LAB — CBC WITH DIFFERENTIAL/PLATELET
BASOS ABS: 0 10*3/uL (ref 0.0–0.1)
BASOS PCT: 0 %
EOS ABS: 0.1 10*3/uL (ref 0.0–0.7)
Eosinophils Relative: 2 %
HEMATOCRIT: 37 % (ref 36.0–46.0)
HEMOGLOBIN: 12.4 g/dL (ref 12.0–15.0)
Lymphocytes Relative: 17 %
Lymphs Abs: 1.5 10*3/uL (ref 0.7–4.0)
MCH: 28.8 pg (ref 26.0–34.0)
MCHC: 33.5 g/dL (ref 30.0–36.0)
MCV: 85.8 fL (ref 78.0–100.0)
MONOS PCT: 6 %
Monocytes Absolute: 0.5 10*3/uL (ref 0.1–1.0)
NEUTROS ABS: 6.4 10*3/uL (ref 1.7–7.7)
NEUTROS PCT: 75 %
Platelets: 241 10*3/uL (ref 150–400)
RBC: 4.31 MIL/uL (ref 3.87–5.11)
RDW: 13.5 % (ref 11.5–15.5)
WBC: 8.5 10*3/uL (ref 4.0–10.5)

## 2016-03-19 LAB — BASIC METABOLIC PANEL
ANION GAP: 10 (ref 5–15)
BUN: 10 mg/dL (ref 6–20)
CALCIUM: 9.3 mg/dL (ref 8.9–10.3)
CO2: 27 mmol/L (ref 22–32)
CREATININE: 0.59 mg/dL (ref 0.44–1.00)
Chloride: 101 mmol/L (ref 101–111)
Glucose, Bld: 85 mg/dL (ref 65–99)
Potassium: 3.9 mmol/L (ref 3.5–5.1)
SODIUM: 138 mmol/L (ref 135–145)

## 2016-03-19 MED ORDER — OXYCODONE-ACETAMINOPHEN 5-325 MG PO TABS: 1.0000 | ORAL_TABLET | ORAL | 0 refills | Status: DC | PRN

## 2016-03-19 MED ORDER — CLINDAMYCIN HCL 300 MG PO CAPS: 300.0000 mg | ORAL_CAPSULE | Freq: Four times a day (QID) | ORAL | 0 refills | Status: DC

## 2016-03-19 MED ORDER — CLINDAMYCIN PHOSPHATE 600 MG/50ML IV SOLN
600.0000 mg | Freq: Once | INTRAVENOUS | Status: AC
  Administered 2016-03-19: 600 mg via INTRAVENOUS
  Filled 2016-03-19: qty 50

## 2016-03-19 MED ORDER — SODIUM CHLORIDE 0.9 % IV BOLUS (SEPSIS)
1000.0000 mL | Freq: Once | INTRAVENOUS | Status: AC
  Administered 2016-03-19: 1000 mL via INTRAVENOUS

## 2016-03-19 MED ORDER — IOPAMIDOL (ISOVUE-300) INJECTION 61%
75.0000 mL | Freq: Once | INTRAVENOUS | Status: AC | PRN
  Administered 2016-03-19: 75 mL via INTRAVENOUS

## 2016-03-19 MED ORDER — IOPAMIDOL (ISOVUE-300) INJECTION 61%
INTRAVENOUS | Status: AC
  Filled 2016-03-19: qty 75

## 2016-03-19 MED ORDER — IBUPROFEN 800 MG PO TABS
800.0000 mg | ORAL_TABLET | Freq: Once | ORAL | Status: AC
  Administered 2016-03-19: 800 mg via ORAL
  Filled 2016-03-19: qty 1

## 2016-03-19 MED ORDER — ONDANSETRON HCL 4 MG/2ML IJ SOLN
4.0000 mg | Freq: Once | INTRAMUSCULAR | Status: AC
  Administered 2016-03-19: 4 mg via INTRAVENOUS
  Filled 2016-03-19: qty 2

## 2016-03-19 MED ORDER — IBUPROFEN 600 MG PO TABS: 600.0000 mg | ORAL_TABLET | Freq: Three times a day (TID) | ORAL | 0 refills | Status: DC | PRN

## 2016-03-19 NOTE — ED Triage Notes (Signed)
Pt here with mouth pain.  Pt had wisdom teeth removed on Wednesday.  Pt is having difficulty opening mouth.  States she has not been able to get out of bed and that the pain meds are not working.  Concerned dentist did "something wrong".  Attempted to reach him.  Not if office and on call number not working.

## 2016-03-19 NOTE — ED Notes (Signed)
Patient transported to CT 

## 2016-03-19 NOTE — Discharge Instructions (Signed)
Read the information below.  Use the prescribed medication as directed.  Please discuss all new medications with your pharmacist.  You may return to the Emergency Department at any time for worsening condition or any new symptoms that concern you.  If you develop fevers, increased swelling in your face, difficulty swallowing or breathing, return to the ER immediately for a recheck.

## 2016-03-19 NOTE — ED Provider Notes (Signed)
[facility]-EMERGENCY DEPT Provider Note   CSN: [id number] Arrival date & time: 03/19/16  4098   By signing my name below, I, Teofilo Pod, attest that this documentation has been prepared under the direction and in the presence of [facility], [place]. Electronically Signed: Teofilo Pod, ED Scribe. 03/19/2016. 10:15 AM.   History   Chief Complaint Chief Complaint  Patient presents with  . Dental Pain    The history is provided by the patient. No language interpreter was used.   HPI Comments:  Miranda Morales is a 36 y.o. female who presents to the Emergency Department complaining of constant dental pain x 2 days. Pt states that she has been having difficulty opening her mouth and chewing, and has had pain with brushing her teeth and swallowing. Pt had 1 wisdom tooth extracted 2 days ago. Pt complains of associated associated swelling to the area, sore throat, nausea, mild ear pain, and a fever of 100 2 days ago that has since resolved. Her dentist is Dr. Durenda Hurt at [facility]. Pt was prescribed Vicodin, not helping with pain.  She did take some ibuprofen this morning that helped more - does not take this often because she also takes meloxicam occasionally, though not in the last few days. Pt reports that she was given a steroid and Toradol shot 3 days ago by her PCP for a headache. Pt has used ice on her face with no relief. Pt denies difficulty breathing, swallowing.   Past Medical History:  Diagnosis Date  . Anxiety   . Coccyx pain    --?broken during childbirth  . Depression   . H/O cervical polypectomy   . Seasonal allergies   . Urinary incontinence     There are no active problems to display for this patient.   Past Surgical History:  Procedure Laterality Date  . arthroscopic knee Right 2003    OB History    Gravida Para Term Preterm AB Living   2 2 2     2    SAB TAB Ectopic Multiple Live Births                   Home Medications    Prior to  Admission medications   Medication Sig Start Date End Date Taking? Authorizing Provider  fexofenadine (ALLEGRA) 180 MG tablet Take 180 mg by mouth daily as needed for allergies.    Yes Historical Provider, MD  FLUoxetine (PROZAC) 10 MG capsule Take 20 mg by mouth every morning.  07/10/15 07/09/16 Yes Historical Provider, MD  meloxicam (MOBIC) 15 MG tablet Take 15 mg by mouth daily as needed for pain.  05/30/15  Yes Historical Provider, MD  Multiple Vitamin (MULTIVITAMIN) capsule Take 1 capsule by mouth daily.   Yes Historical Provider, MD  Norethindrone Acetate-Ethinyl Estradiol (JUNEL 1.5/30) 1.5-30 MG-MCG tablet Take 1 tablet by mouth daily. 11/28/15  Yes Brook E Ardell Isaacs, MD  rOPINIRole (REQUIP) 0.25 MG tablet Take 0.25 mg by mouth at bedtime as needed (restless leg).   Yes Historical Provider, MD  clindamycin (CLEOCIN) 300 MG capsule Take 1 capsule (300 mg total) by mouth 4 (four) times daily. X 7 days 03/19/16   Trixie Dredge, PA-C  ibuprofen (ADVIL,MOTRIN) 600 MG tablet Take 1 tablet (600 mg total) by mouth every 8 (eight) hours as needed. 03/19/16   Trixie Dredge, PA-C  oxyCODONE-acetaminophen (PERCOCET/ROXICET) 5-325 MG tablet Take 1-2 tablets by mouth every 4 (four) hours as needed for moderate pain or severe  pain. 03/19/16   Trixie Dredge, PA-C    Family History Family History  Problem Relation Age of Onset  . Breast cancer Maternal Grandmother 34    dec from small bowel obstruction  . Thyroid disease Mother     hypothyroidism  . Hypertension Father   . Hypertension Paternal Grandfather     Social History Social History  Substance Use Topics  . Smoking status: Never Smoker  . Smokeless tobacco: Never Used  . Alcohol use 4.2 oz/week    7 Standard drinks or equivalent per week     Comment: 1 glass of wine/night     Allergies   Patient has no known allergies.   Review of Systems Review of Systems  Constitutional: Positive for fever.  HENT: Positive for dental problem, ear pain,  facial swelling, sore throat and trouble swallowing.   Respiratory: Negative for shortness of breath.   Gastrointestinal: Positive for nausea. Negative for vomiting.  Musculoskeletal: Negative for neck stiffness.  Skin: Negative for color change.  Allergic/Immunologic: Negative for immunocompromised state.     Physical Exam Updated Vital Signs BP 121/83   Pulse 82   Temp 98.8 F (37.1 C) (Oral)   Resp 15   LMP 03/02/2016   SpO2 97%   Physical Exam  Constitutional: She appears well-developed and well-nourished. No distress.  HENT:  Head: Normocephalic and atraumatic.  Mouth/Throat: Uvula is midline and oropharynx is clear and moist. Mucous membranes are not dry. No uvula swelling. No oropharyngeal exudate, posterior oropharyngeal edema, posterior oropharyngeal erythema or tonsillar abscesses.  Left lower molar space with white discharge and mild erythema. Pt with trismus that is limiting exam. Oropharynx clear without edema or erythema. Uvula midline. Tenderness and swelling on the left submandibular space.   Neck: Trachea normal, normal range of motion and phonation normal. Neck supple. No tracheal tenderness present. No neck rigidity. No tracheal deviation, no edema, no erythema and normal range of motion present.  Cardiovascular: Normal rate.   Pulmonary/Chest: Effort normal and breath sounds normal. No stridor.  Lymphadenopathy:    She has no cervical adenopathy.  Neurological: She is alert.  Skin: She is not diaphoretic.  Nursing note and vitals reviewed.    ED Treatments / Results  DIAGNOSTIC STUDIES:  Oxygen Saturation is 97% on RA, normal by my interpretation.    COORDINATION OF CARE:  10:15 AM Discussed treatment plan with pt at bedside and pt agreed to plan.   Labs (all labs ordered are listed, but only abnormal results are displayed) Labs Reviewed  BASIC METABOLIC PANEL  CBC WITH DIFFERENTIAL/PLATELET    EKG  EKG Interpretation None        Radiology Ct Soft Tissue Neck W Contrast  Result Date: 03/19/2016 CLINICAL DATA:  Dental pain. Pain associated with opening of the mouth. Left mandibular wisdom tooth extraction 2 days ago. Left-sided facial pain and swelling. EXAM: CT NECK WITH CONTRAST TECHNIQUE: Multidetector CT imaging of the neck was performed using the standard protocol following the bolus administration of intravenous contrast. CONTRAST:  75mL ISOVUE-300 IOPAMIDOL (ISOVUE-300) INJECTION 61% COMPARISON:  None. FINDINGS: Pharynx and larynx: There is mild prominence of the palatine tonsils bilaterally. No discrete mass lesion is present. No focal mucosal or submucosal lesion is present. The nasopharynx, oropharynx, and hypopharynx are otherwise within normal limits. Vocal cords are midline and symmetric. Salivary glands: There is some soft tissue swelling about the left submandibular gland. The gland is within normal limits. The right submandibular gland and bilateral parotid glands are  unremarkable. Thyroid: Within normal limits. Lymph nodes: Asymmetric left submandibular and level 2 lymph nodes are likely reactive. No significant cervical adenopathy is present. Vascular: Negative. Limited intracranial: Within normal limits. Visualized orbits: The globes and orbits are intact. Mastoids and visualized paranasal sinuses: The paranasal sinuses and mastoid air cells are clear. Skeleton: There is some straightening of the normal cervical lordosis. Upper chest: The lung apices are clear. The superior mediastinum is within normal limits. Other: The left third molar extraction is noted. There is some gas within the extraction socket, as expected. A subperiosteal collection extends along the course of the body of the left mandible laterally measuring 20 x 5 mm. Soft tissue swelling is noted lateral to the mandible and into the left submandibular space. There is no other discrete abscess. IMPRESSION: 1. 20 x 5 mm subperiosteal fluid  collection/abscess just anterior to the extraction socket of the left third molar along lateral aspect of the mandible. 2. Extensive soft tissue swelling lateral and inferior to the left mandible without other discrete abscess. 3. Reactive left submandibular and level 2 lymph nodes without significant adenopathy otherwise. Electronically Signed   By: Marin Roberts M.D.   On: 03/19/2016 12:28    Procedures Procedures (including critical care time)  Medications Ordered in ED Medications  sodium chloride 0.9 % bolus 1,000 mL (0 mLs Intravenous Stopped 03/19/16 1426)  iopamidol (ISOVUE-300) 61 % injection 75 mL (75 mLs Intravenous Contrast Given 03/19/16 1204)  clindamycin (CLEOCIN) IVPB 600 mg (0 mg Intravenous Stopped 03/19/16 1544)  ondansetron (ZOFRAN) injection 4 mg (4 mg Intravenous Given 03/19/16 1428)  ibuprofen (ADVIL,MOTRIN) tablet 800 mg (800 mg Oral Given 03/19/16 1427)     Initial Impression / Assessment and Plan / ED Course  I have reviewed the triage vital signs and the nursing notes.  Pertinent labs & imaging results that were available during my care of the patient were reviewed by me and considered in my medical decision making (see chart for details).  Clinical Course as of Mar 19 1557  Caleen Essex Mar 19, 2016  1251 I called [facility] office to speak with Dr Noe Gens regarding patient's condition and CT results.  He will return call shortly.    [EW]  1340 Called Dr Chip Boer office again.    [EW]    Clinical Course User Index [EW] Trixie Dredge, PA-C    Afebrile nontoxic patient with pain and swelling, trismus following wisdom tooth extraction two days ago.  CT neck demonstrates fluid collection/abscess.  I was able to speak with Dr Noe Gens who recommends IV clinda and PO clindamycin for home and he will speak with patient later today and see her in the office tomorrow or Monday depending on how she is feeling.  Pt given IVF, symptomatic medications as well.  Labs  reassuring.  D/C home with percocet, motrin, clinda, dental follow up. Discussed result, findings, treatment, and follow up  with patient.  Pt given return precautions.  Pt verbalizes understanding and agrees with plan.      Final Clinical Impressions(s) / ED Diagnoses   Final diagnoses:  Dental abscess    New Prescriptions Discharge Medication List as of 03/19/2016  3:34 PM    START taking these medications   Details  clindamycin (CLEOCIN) 300 MG capsule Take 1 capsule (300 mg total) by mouth 4 (four) times daily. X 7 days, Starting Fri 03/19/2016, Print    ibuprofen (ADVIL,MOTRIN) 600 MG tablet Take 1 tablet (600 mg total) by mouth every 8 (  eight) hours as needed., Starting Fri 03/19/2016, Print    oxyCODONE-acetaminophen (PERCOCET/ROXICET) 5-325 MG tablet Take 1-2 tablets by mouth every 4 (four) hours as needed for moderate pain or severe pain., Starting Fri 03/19/2016, Print        I personally performed the services described in this documentation, which was scribed in my presence. The recorded information has been reviewed and is accurate.     Trixie Dredgemily Raynesha Tiedt, PA-C 03/19/16 1559    Lorre NickAnthony Allen, MD 03/20/16 [id number]

## 2016-03-19 NOTE — ED Notes (Signed)
Patient given tomato soup and water.

## 2016-10-13 ENCOUNTER — Telehealth: Payer: Self-pay | Admitting: Obstetrics and Gynecology

## 2016-10-13 NOTE — Telephone Encounter (Signed)
Left message to call Laiken Sandy at [phone].  

## 2016-10-13 NOTE — Telephone Encounter (Signed)
Patient would like an appointment to have a polyp rechecked.

## 2016-10-13 NOTE — Telephone Encounter (Signed)
Spoke with patient. Would like to schedule OV to check "cervical polyp". Unable to provide specific details, is at work. Reports "different pains here and there", irregular cycles, clear, yellow vaginal discharge. Taking OCP regularly, LMP 10/11/16. Denies vaginal odor, urinary complaints or heavy bleeding. "Knows when polyps are becoming problematic".  Patient request OV for week of Aug 20th, declined earlier appt. Patient scheduled for OV on 7/23 at 3:30pm. AEX scheduled for 12/01/16 at 9am. Advised patient would review with Dr. Edward JollySilva and return call with any additional recommendations, patient is agreeable.  Last PUS 08/07/15  Routing to provider for final review. Patient is agreeable to disposition. Will close encounter.

## 2016-11-04 ENCOUNTER — Encounter: Payer: Self-pay | Admitting: Obstetrics and Gynecology

## 2016-11-04 ENCOUNTER — Ambulatory Visit (INDEPENDENT_AMBULATORY_CARE_PROVIDER_SITE_OTHER): Payer: Medicaid Other | Admitting: Obstetrics and Gynecology

## 2016-11-04 VITALS — BP 110/70 | HR 84 | Ht 66.5 in | Wt 203.4 lb

## 2016-11-04 DIAGNOSIS — N926 Irregular menstruation, unspecified: Secondary | ICD-10-CM | POA: Diagnosis not present

## 2016-11-04 LAB — POCT URINE PREGNANCY: Preg Test, Ur: NEGATIVE

## 2016-11-04 MED ORDER — LEVONORGEST-ETH ESTRAD 91-DAY 0.15-0.03 MG PO TABS: 1.0000 | ORAL_TABLET | Freq: Every day | ORAL | 0 refills | Status: DC

## 2016-11-04 NOTE — Patient Instructions (Signed)
Ethinyl Estradiol; Levonorgestrel tablets What is this medicine? ETHINYL ESTRADIOL; LEVONORGESTREL (ETH in il es tra DYE ole; LEE voh nor jes trel) is an oral contraceptive. It combines two types of female hormones, an estrogen and a progestin. They are used to prevent ovulation and pregnancy. This medicine may be used for other purposes; ask your health care provider or pharmacist if you have questions. COMMON BRAND NAME(S): Alesse, Altavera, Amethia, Amethia Lo, Amethyst, Ashlyna, Aubra-28, Aviane, Camrese, Camrese Lo, Chateal, Daysee, Delyla, Enpresse, FALMINA, Fayosin, Introvale, Isibloom, Jolessa, Kurvelo, Lessina, Levlen, Levlite, LEVONEST, Levonorgestrel/Ethinyl Estradiol, Levora, LoSeasonique, Lutera, Lybrel, MARLISSA, Myzilra, Nordette, Orsythia, Portia, Quartette, Quasense, Seasonale, Seasonique, Setlakin, Sronyx, Tri-Levlen, Triphasil, Trivora, Vienva What should I tell my health care provider before I take this medicine? They need to know if you have or ever had any of these conditions: -abnormal vaginal bleeding -blood vessel disease or blood clots -breast, cervical, endometrial, ovarian, liver, or uterine cancer -diabetes -gallbladder disease -heart disease or recent heart attack -high blood pressure -high cholesterol -kidney disease -liver disease -migraine headaches -stroke -systemic lupus erythematosus (SLE) -tobacco smoker -an unusual or allergic reaction to estrogens, progestins, other medicines, foods, dyes, or preservatives -pregnant or trying to get pregnant -breast-feeding How should I use this medicine? Take this medicine by mouth. To reduce nausea, this medicine may be taken with food. Follow the directions on the prescription label. Take this medicine at the same time each day and in the order directed on the package. Do not take your medicine more often than directed. Contact your pediatrician regarding the use of this medicine in children. Special care may be  needed. This medicine has been used in female children who have started having menstrual periods. A patient package insert for the product will be given with each prescription and refill. Read this sheet carefully each time. The sheet may change frequently. Overdosage: If you think you have taken too much of this medicine contact a poison control center or emergency room at once. NOTE: This medicine is only for you. Do not share this medicine with others. What if I miss a dose? If you miss a dose, refer to the patient information sheet you received with your medicine for direction. If you miss more than one pill, this medicine may not be as effective and you may need to use another form of birth control. What may interact with this medicine? Do not take this medicine with the following medication: -dasabuvir; ombitasvir; paritaprevir; ritonavir -ombitasvir; paritaprevir; ritonavir This medicine may also interact with the following medications: -acetaminophen -antibiotics or medicines for infections, especially rifampin, rifabutin, rifapentine, and griseofulvin, and possibly penicillins or tetracyclines -aprepitant -ascorbic acid (vitamin C) -atorvastatin -barbiturate medicines, such as phenobarbital -bosentan -carbamazepine -caffeine -clofibrate -cyclosporine -dantrolene -doxercalciferol -felbamate -grapefruit juice -hydrocortisone -medicines for anxiety or sleeping problems, such as diazepam or temazepam -medicines for diabetes, including pioglitazone -mineral oil -modafinil -mycophenolate -nefazodone -oxcarbazepine -phenytoin -prednisolone -ritonavir or other medicines for HIV infection or AIDS -rosuvastatin -selegiline -soy isoflavones supplements -St. John's wort -tamoxifen or raloxifene -theophylline -thyroid hormones -topiramate -warfarin This list may not describe all possible interactions. Give your health care provider a list of all the medicines, herbs,  non-prescription drugs, or dietary supplements you use. Also tell them if you smoke, drink alcohol, or use illegal drugs. Some items may interact with your medicine. What should I watch for while using this medicine? Visit your doctor or health care professional for regular checks on your progress. You will need a regular breast and pelvic   exam and Pap smear while on this medicine. Use an additional method of contraception during the first cycle that you take these tablets. If you have any reason to think you are pregnant, stop taking this medicine right away and contact your doctor or health care professional. If you are taking this medicine for hormone related problems, it may take several cycles of use to see improvement in your condition. Smoking increases the risk of getting a blood clot or having a stroke while you are taking birth control pills, especially if you are more than 38 years old. You are strongly advised not to smoke. This medicine can make your body retain fluid, making your fingers, hands, or ankles swell. Your blood pressure can go up. Contact your doctor or health care professional if you feel you are retaining fluid. This medicine can make you more sensitive to the sun. Keep out of the sun. If you cannot avoid being in the sun, wear protective clothing and use sunscreen. Do not use sun lamps or tanning beds/booths. If you wear contact lenses and notice visual changes, or if the lenses begin to feel uncomfortable, consult your eye care specialist. In some women, tenderness, swelling, or minor bleeding of the gums may occur. Notify your dentist if this happens. Brushing and flossing your teeth regularly may help limit this. See your dentist regularly and inform your dentist of the medicines you are taking. If you are going to have elective surgery, you may need to stop taking this medicine before the surgery. Consult your health care professional for advice. This medicine does not  protect you against HIV infection (AIDS) or any other sexually transmitted diseases. What side effects may I notice from receiving this medicine? Side effects that you should report to your doctor or health care professional as soon as possible: -breast tissue changes or discharge -changes in vaginal bleeding during your period or between your periods -chest pain -coughing up blood -dizziness or fainting spells -headaches or migraines -leg, arm or groin pain -severe or sudden headaches -stomach pain (severe) -sudden shortness of breath -sudden loss of coordination, especially on one side of the body -speech problems -symptoms of vaginal infection like itching, irritation or unusual discharge -tenderness in the upper abdomen -vomiting -weakness or numbness in the arms or legs, especially on one side of the body -yellowing of the eyes or skin Side effects that usually do not require medical attention (report to your doctor or health care professional if they continue or are bothersome): -breakthrough bleeding and spotting that continues beyond the 3 initial cycles of pills -breast tenderness -mood changes, anxiety, depression, frustration, anger, or emotional outbursts -increased sensitivity to sun or ultraviolet light -nausea -skin rash, acne, or brown spots on the skin -weight gain (slight) This list may not describe all possible side effects. Call your doctor for medical advice about side effects. You may report side effects to FDA at 1-800-FDA-1088. Where should I keep my medicine? Keep out of the reach of children. Store at room temperature between 15 and 30 degrees C (59 and 86 degrees F). Throw away any unused medicine after the expiration date. NOTE: This sheet is a summary. It may not cover all possible information. If you have questions about this medicine, talk to your doctor, pharmacist, or health care provider.  2018 Elsevier/Gold Standard (2015-11-10 07:58:22)  

## 2016-11-04 NOTE — Progress Notes (Signed)
GYNECOLOGY  VISIT   HPI: 36 y.o.   Married  Caucasian  female   G2P2002 with Patient's last menstrual period was 10/11/2016 (exact date).   here for irregular bleeding and feels she may have another cervical polyp.   On OCPs.  Can last 2 days - 2 weeks.  (The two week cycle occurred only once.) Missed one pill 2 times in the last 6 months.  Change in color of menstruation.   Some cramping.    Interested in taking continuous OCPs and having cycle less often.  Does not want Mirena.  Cannot wear tampons due to prolapse so does not want NuvaRing.   Separated from husband.  Had STD testing with the separation.  No new partners.  [id number]  GYNECOLOGIC HISTORY: Patient's last menstrual period was 10/11/2016 (exact date). Contraception:  OCPs--Junel Menopausal hormone therapy:  n/a Last mammogram:  08-06-15 Diag.Bil./Density B/Neg/BiRads2 Last pap smear: 11-28-15 Neg:Neg HR HPV        OB History    Gravida Para Term Preterm AB Living   2 2 2     2    SAB TAB Ectopic Multiple Live Births                     There are no active problems to display for this patient.   Past Medical History:  Diagnosis Date  . Anxiety   . Coccyx pain    --?broken during childbirth  . Depression   . H/O cervical polypectomy   . Seasonal allergies   . Urinary incontinence     Past Surgical History:  Procedure Laterality Date  . arthroscopic knee Right 2003    Current Outpatient Prescriptions  Medication Sig Dispense Refill  . acetaminophen (TYLENOL) 500 MG tablet Take 500 mg by mouth every 6 (six) hours as needed.    . cetirizine (ZYRTEC) 10 MG tablet Take 10 mg by mouth daily.    Marland Kitchen FLUoxetine (PROZAC) 40 MG capsule Take 1 capsule by mouth daily.    . meloxicam (MOBIC) 15 MG tablet Take 15 mg by mouth daily as needed for pain.   3  . Multiple Vitamin (MULTIVITAMIN) capsule Take 1 capsule by mouth daily.    . Norethindrone Acetate-Ethinyl Estradiol (JUNEL 1.5/30) 1.5-30 MG-MCG  tablet Take 1 tablet by mouth daily. 1 Package 11  . rOPINIRole (REQUIP) 0.25 MG tablet Take 0.25 mg by mouth at bedtime as needed (restless leg).     No current facility-administered medications for this visit.      ALLERGIES: Patient has no known allergies.  Family History  Problem Relation Age of Onset  . Breast cancer Maternal Grandmother 30       dec from small bowel obstruction  . Thyroid disease Mother        hypothyroidism  . Hypertension Father   . Hypertension Paternal Grandfather     Social History   Social History  . Marital status: Married    Spouse name: N/A  . Number of children: N/A  . Years of education: N/A   Occupational History  . Not on file.   Social History Main Topics  . Smoking status: Never Smoker  . Smokeless tobacco: Never Used  . Alcohol use 4.2 oz/week    7 Standard drinks or equivalent per week     Comment: 1 glass of wine/night  . Drug use: No  . Sexual activity: Yes    Partners: Male    Birth control/ protection: Condom, Pill  Comment: Junel   Other Topics Concern  . Not on file   Social History Narrative  . No narrative on file    ROS:  Pertinent items are noted in HPI.  PHYSICAL EXAMINATION:    BP 110/70 (BP Location: Right Arm, Patient Position: Sitting, Cuff Size: Small)   Pulse 84   Ht 5' 6.5" (1.689 m)   Wt 203 lb 6.4 oz (92.3 kg)   LMP 10/11/2016 (Exact Date)   BMI 32.34 kg/m     General appearance: alert, cooperative and appears stated age   Pelvic: External genitalia:  no lesions              Urethra:  normal appearing urethra with no masses, tenderness or lesions              Bartholins and Skenes: normal                 Vagina: normal appearing vagina with normal color and discharge, no lesions              Cervix: no lesions.  No polyp.   Minor bleeding today.                 Bimanual Exam:  Uterus:  normal size, contour, position, consistency, mobility, non-tender              Adnexa: no mass,  fullness, tenderness               Chaperone was present for exam.  ASSESSMENT  Irregular bleeding with Lo Estrin 1.5/30.  Desire for amenorrhea. No polyp.   PLAN  Switch to Seasonale.  Follow up in 1 month for annual exam and prn.    An After Visit Summary was printed and given to the patient.  _15_____ minutes face to face time of which over 50% was spent in counseling.

## 2016-11-25 NOTE — Progress Notes (Deleted)
36 y.o. G1P2002 Married Caucasian female here for annual exam.    PCP:     No LMP recorded.           Sexually active: {yes no:314532}  The current method of family planning is OCP (estrogen/progesterone)--Junel.    Exercising: {yes no:314532}  {types:19826} Smoker:  no  Health Maintenance: Pap: 11-28-15 Neg:Neg HR HPV History of abnormal Pap:  {YES NO:22349} MMG: 08-06-15 Diag.Bil and Bil.U/S for bil.discharge--Density B/Neg/screening age 37/BiRads2:TBC Colonoscopy:  *** BMD:   ***  Result  *** TDaP:  *** Gardasil:   no HIV:*** Hep C:*** Screening Labs:  Hb today: ***, Urine today: ***   reports that she has never smoked. She has never used smokeless tobacco. She reports that she drinks about 4.2 oz of alcohol per week . She reports that she does not use drugs.  Past Medical History:  Diagnosis Date  . Anxiety   . Coccyx pain    --?broken during childbirth  . Depression   . H/O cervical polypectomy   . Seasonal allergies   . Urinary incontinence     Past Surgical History:  Procedure Laterality Date  . arthroscopic knee Right 2003    Current Outpatient Prescriptions  Medication Sig Dispense Refill  . acetaminophen (TYLENOL) 500 MG tablet Take 500 mg by mouth every 6 (six) hours as needed.    . cetirizine (ZYRTEC) 10 MG tablet Take 10 mg by mouth daily.    Marland Kitchen FLUoxetine (PROZAC) 40 MG capsule Take 1 capsule by mouth daily.    Marland Kitchen levonorgestrel-ethinyl estradiol (SEASONALE,INTROVALE,JOLESSA) 0.15-0.03 MG tablet Take 1 tablet by mouth daily. 1 Package 0  . meloxicam (MOBIC) 15 MG tablet Take 15 mg by mouth daily as needed for pain.   3  . Multiple Vitamin (MULTIVITAMIN) capsule Take 1 capsule by mouth daily.    Marland Kitchen rOPINIRole (REQUIP) 0.25 MG tablet Take 0.25 mg by mouth at bedtime as needed (restless leg).     No current facility-administered medications for this visit.     Family History  Problem Relation Age of Onset  . Breast cancer Maternal Grandmother 94   dec from small bowel obstruction  . Thyroid disease Mother        hypothyroidism  . Hypertension Father   . Hypertension Paternal Grandfather     ROS:  Pertinent items are noted in HPI.  Otherwise, a comprehensive ROS was negative.  Exam:   There were no vitals taken for this visit.    General appearance: alert, cooperative and appears stated age Head: Normocephalic, without obvious abnormality, atraumatic Neck: no adenopathy, supple, symmetrical, trachea midline and thyroid normal to inspection and palpation Lungs: clear to auscultation bilaterally Breasts: normal appearance, no masses or tenderness, No nipple retraction or dimpling, No nipple discharge or bleeding, No axillary or supraclavicular adenopathy Heart: regular rate and rhythm Abdomen: soft, non-tender; no masses, no organomegaly Extremities: extremities normal, atraumatic, no cyanosis or edema Skin: Skin color, texture, turgor normal. No rashes or lesions Lymph nodes: Cervical, supraclavicular, and axillary nodes normal. No abnormal inguinal nodes palpated Neurologic: Grossly normal  Pelvic: External genitalia:  no lesions              Urethra:  normal appearing urethra with no masses, tenderness or lesions              Bartholins and Skenes: normal                 Vagina: normal appearing vagina with normal color and  discharge, no lesions              Cervix: no lesions              Pap taken: {yes no:314532} Bimanual Exam:  Uterus:  normal size, contour, position, consistency, mobility, non-tender              Adnexa: no mass, fullness, tenderness              Rectal exam: {yes no:314532}.  Confirms.              Anus:  normal sphincter tone, no lesions  Chaperone was present for exam.  Assessment:   Well woman visit with normal exam.   Plan: Mammogram screening discussed. Recommended self breast awareness. Pap and HR HPV as above. Guidelines for Calcium, Vitamin D, regular exercise program including  cardiovascular and weight bearing exercise.   Follow up annually and prn.   Additional counseling given.  {yes [id number]:314532}. _______ minutes face to face time of which over 50% was spent in counseling.    After visit summary provided.

## 2016-12-01 ENCOUNTER — Ambulatory Visit: Payer: Self-pay | Admitting: Obstetrics and Gynecology

## 2017-01-11 ENCOUNTER — Ambulatory Visit: Payer: Self-pay | Admitting: Obstetrics and Gynecology

## 2017-01-14 ENCOUNTER — Telehealth: Payer: Self-pay | Admitting: *Deleted

## 2017-01-14 ENCOUNTER — Encounter: Payer: Self-pay | Admitting: Obstetrics and Gynecology

## 2017-01-14 NOTE — Telephone Encounter (Signed)
See phone encounter for attempt to call patient.   Routing to provider for final review. Patient agreeable to disposition. Will close encounter.

## 2017-01-14 NOTE — Telephone Encounter (Signed)
My Chart message from patient:  Hi Dr Edward JollySilva. I just had a question about this birth control. I was supposed to have my annual exam with you but had to cancel because I can't stop having a period. I am on day 15 now and the way I feel feels like it's gonna keep raging. I'm not even supposed to be having a period at all. I think I missed one day. We lost a family member and stuff has been crazy so I know I missed one day but is that enough for this. More than two weeks of period is aggravating. Thanks- [facility]    Select Font Size      Miranda Morales  01/14/2017  Patient Email  MRN:  [id number]  Description: 36 year old female Provider: Patton SallesAmundson C Silva, Brook E, MD [facility]

## 2017-01-14 NOTE — Telephone Encounter (Signed)
Call to patient regarding My Chart message.  ROI indicates can leave message on mobile number; hoever, no voice mail set up. Call to home number listed which has name confirmation. Left generalized voice mail message that we are calling regarding her message and call back to triage nurse.

## 2017-01-17 NOTE — Telephone Encounter (Signed)
Her her take the Seasonale one pill by mouth twice a day for the next 5 days and then resume the normal schedule of one pill by mouth daily.  Please let me know if the bleeding does not improve.

## 2017-01-17 NOTE — Telephone Encounter (Signed)
Spoke with patient. Patient is taking Seasonale. Is in her second pack. States that she missed 1 pill a few weeks ago. Is on day 18 of bleeding. Switched from taking Loestrin 1.5/30 to Seasonale in August. Reports bleeding is bright red and mild. Denies any heavy bleeding. Patient is very frustrated with ongoing bleeding. Asking how she can stop the bleeding. Advised will review with Dr.Silva and return call.

## 2017-01-18 NOTE — Telephone Encounter (Signed)
Left message to call Kaitlyn at [phone]. 

## 2017-01-18 NOTE — Telephone Encounter (Signed)
Spoke with patient. Advised of message as seen below from Dr.Silva. Patient verbalizes understanding. Will close encounter. 

## 2017-02-01 ENCOUNTER — Other Ambulatory Visit: Payer: Self-pay | Admitting: Obstetrics and Gynecology

## 2017-02-01 NOTE — Telephone Encounter (Signed)
Medication refill request: OCP  Last OV:  11-04-16 Next AEX: [id number]  Last MMG (if hormonal medication request): 08-06-15 WNL  Refill authorized: please advise

## 2017-04-11 ENCOUNTER — Ambulatory Visit: Payer: Self-pay | Admitting: Obstetrics and Gynecology

## 2017-04-15 ENCOUNTER — Ambulatory Visit (INDEPENDENT_AMBULATORY_CARE_PROVIDER_SITE_OTHER): Payer: Medicaid Other | Admitting: Obstetrics and Gynecology

## 2017-04-15 ENCOUNTER — Encounter: Payer: Self-pay | Admitting: Obstetrics and Gynecology

## 2017-04-15 VITALS — BP 110/72 | HR 76 | Resp 18 | Ht 67.5 in | Wt 212.6 lb

## 2017-04-15 DIAGNOSIS — Z01419 Encounter for gynecological examination (general) (routine) without abnormal findings: Secondary | ICD-10-CM

## 2017-04-15 DIAGNOSIS — Z0000 Encounter for general adult medical examination without abnormal findings: Secondary | ICD-10-CM | POA: Diagnosis not present

## 2017-04-15 MED ORDER — LEVONORGEST-ETH ESTRAD 91-DAY 0.15-0.03 MG PO TABS: 1.0000 | ORAL_TABLET | Freq: Every day | ORAL | 3 refills | Status: DC

## 2017-04-15 NOTE — Patient Instructions (Signed)

## 2017-04-15 NOTE — Progress Notes (Signed)
37 y.o. 332P2002 Married Caucasian female here for annual exam.    On OCPs.  Occasional cramping.  Had irregular bleeding with a missed pill when her husband died.   Has prolapse.  Has some urinary and fecal control issues.   Husband passed from an accident in the fall.  Father just passed away from cancer a few days ago.   Labs with PCP.   PCP:   Georgette ShellSara Spencer, PA  Patient's last menstrual period was 02/01/2017.           Sexually active: No.  The current method of family planning is OCP (estrogen/progesterone).    Exercising: No.  The patient does not participate in regular exercise at present. Smoker:  no  Health Maintenance: Pap:  11/28/15 Pap and HR HPV negative History of abnormal Pap:  no MMG: 08/06/15 w/ US  -- BIRADS 2 benign TDaP:  07/10/15 Gardasil:   n/a HIV: 12/09/15 negative Hep C: 12/09/15 Negative Screening Labs:  PCP   reports that  has never smoked. she has never used smokeless tobacco. She reports that she drinks about 4.2 oz of alcohol per week. She reports that she does not use drugs.  Past Medical History:  Diagnosis Date  . Anxiety   . Coccyx pain    --?broken during childbirth  . Depression   . H/O cervical polypectomy   . Seasonal allergies   . Urinary incontinence     Past Surgical History:  Procedure Laterality Date  . arthroscopic knee Right 2003    Current Outpatient Medications  Medication Sig Dispense Refill  . acetaminophen (TYLENOL) 500 MG tablet Take 500 mg by mouth every 6 (six) hours as needed.    . Ascorbic Acid (VITAMIN C) 100 MG tablet Take 500 mg by mouth daily.    . cetirizine (ZYRTEC) 10 MG tablet Take 10 mg by mouth daily.    Marland Kitchen. FLUoxetine (PROZAC) 40 MG capsule Take 1 capsule by mouth daily.    Marland Kitchen. levonorgestrel-ethinyl estradiol (SEASONALE,INTROVALE,JOLESSA) 0.15-0.03 MG tablet TAKE 1 TABLET BY MOUTH EVERY DAY 91 tablet 0  . meloxicam (MOBIC) 15 MG tablet Take 15 mg by mouth daily as needed for pain.   3  . Multiple  Vitamin (MULTIVITAMIN) capsule Take 1 capsule by mouth daily.    Marland Kitchen. rOPINIRole (REQUIP) 0.25 MG tablet Take 0.25 mg by mouth at bedtime as needed (restless leg).     No current facility-administered medications for this visit.     Family History  Problem Relation Age of Onset  . Breast cancer Maternal Grandmother 10875       dec from small bowel obstruction  . Thyroid disease Mother        hypothyroidism  . Hypertension Father   . Hypertension Paternal Grandfather     ROS:  Pertinent items are noted in HPI.  Otherwise, a comprehensive ROS was negative.  Exam:   BP 110/72 (BP Location: Right Arm, Patient Position: Sitting, Cuff Size: Normal)   Pulse 76   Resp 18   Ht 5' 7.5" (1.715 m)   Wt 212 lb 9.6 oz (96.4 kg)   LMP 02/01/2017   BMI 32.81 kg/m     General appearance: alert, cooperative and appears stated age Head: Normocephalic, without obvious abnormality, atraumatic Neck: no adenopathy, supple, symmetrical, trachea midline and thyroid normal to inspection and palpation Lungs: clear to auscultation bilaterally Breasts: normal appearance, no masses or tenderness, No nipple retraction or dimpling, No nipple discharge or bleeding, No axillary or supraclavicular adenopathy  Heart: regular rate and rhythm Abdomen: soft, non-tender; no masses, no organomegaly Extremities: extremities normal, atraumatic, no cyanosis or edema Skin: Skin color, texture, turgor normal. No rashes or lesions Lymph nodes: Cervical, supraclavicular, and axillary nodes normal. No abnormal inguinal nodes palpated Neurologic: Grossly normal  Pelvic: External genitalia:  no lesions              Urethra:  normal appearing urethra with no masses, tenderness or lesions              Bartholins and Skenes: normal                 Vagina: normal appearing vagina with normal color and discharge, no lesions              Cervix: no lesions              Pap taken: No. Bimanual Exam:  Uterus:  normal size, contour,  position, consistency, mobility, non-tender              Adnexa: no mass, fullness, tenderness             Chaperone was present for exam.  Assessment:   Well woman visit with normal exam. Urinary and fecal incontinence.  Hx prolapse.  Normal exam today in lithotomy.  Bereavement.   Plan: Mammogram screening discussed. Recommended self breast awareness. Pap and HR HPV as above. Guidelines for Calcium, Vitamin D, regular exercise program including cardiovascular and weight bearing exercise. Metamucil.  I discussed PT, Impressa, and pessary use.  She will consider a pessary. Support given.  Follow up annually and prn.       After visit summary provided.

## 2017-06-09 DIAGNOSIS — G2581 Restless legs syndrome: Secondary | ICD-10-CM | POA: Insufficient documentation

## 2018-03-15 DIAGNOSIS — K760 Fatty (change of) liver, not elsewhere classified: Secondary | ICD-10-CM

## 2018-03-15 DIAGNOSIS — K8020 Calculus of gallbladder without cholecystitis without obstruction: Secondary | ICD-10-CM

## 2018-03-15 HISTORY — PX: CHOLECYSTECTOMY: SHX55

## 2018-03-15 HISTORY — DX: Fatty (change of) liver, not elsewhere classified: K76.0

## 2018-03-15 HISTORY — DX: Calculus of gallbladder without cholecystitis without obstruction: K80.20

## 2018-04-20 NOTE — Progress Notes (Deleted)
38 y.o. G11P2002 [name] here for annual exam.    PCP:     No LMP recorded.           Sexually active: {yes no:314532}  The current method of family planning is {contraception:315051}.    Exercising: {yes no:314532}  {types:19826} Smoker:  no  Health Maintenance: Pap: 11-28-15 Neg:Neg HR HPV History of abnormal Pap:  no MMG:  08-06-15 Diag.bil.w/Bil.US--Neg/density B/BiRads2--done due to nipple discharge Colonoscopy:  n/a BMD:   n/a  Result  n/a TDaP:  07-10-15 Gardasil:   no HIV:12-09-15 NR Hep C: 12-09-15 Neg Screening Labs:  Hb today: ***, Urine today: ***   reports that she has never smoked. She has never used smokeless tobacco. She reports current alcohol use of about 7.0 standard drinks of alcohol per week. She reports that she does not use drugs.  Past Medical History:  Diagnosis Date  . Anxiety   . Coccyx pain    --?broken during childbirth  . Depression   . H/O cervical polypectomy   . Seasonal allergies   . Urinary incontinence     Past Surgical History:  Procedure Laterality Date  . arthroscopic knee Right 2003    Current Outpatient Medications  Medication Sig Dispense Refill  . acetaminophen (TYLENOL) 500 MG tablet Take 500 mg by mouth every 6 (six) hours as needed.    . Ascorbic Acid (VITAMIN C) 100 MG tablet Take 500 mg by mouth daily.    . cetirizine (ZYRTEC) 10 MG tablet Take 10 mg by mouth daily.    Marland Kitchen FLUoxetine (PROZAC) 40 MG capsule Take 1 capsule by mouth daily.    Marland Kitchen levonorgestrel-ethinyl estradiol (SEASONALE,INTROVALE,JOLESSA) 0.15-0.03 MG tablet Take 1 tablet by mouth daily. 91 tablet 3  . meloxicam (MOBIC) 15 MG tablet Take 15 mg by mouth daily as needed for pain.   3  . Multiple Vitamin (MULTIVITAMIN) capsule Take 1 capsule by mouth daily.    Marland Kitchen rOPINIRole (REQUIP) 0.25 MG tablet Take 0.25 mg by mouth at bedtime as needed (restless leg).     No current facility-administered medications for this visit.     Family History   Problem Relation Age of Onset  . Breast cancer Maternal Grandmother 74       dec from small bowel obstruction  . Thyroid disease Mother        hypothyroidism  . Hypertension Father   . Hypertension Paternal Grandfather     Review of Systems  Exam:   There were no vitals taken for this visit.    General appearance: alert, cooperative and appears stated age Head: Normocephalic, without obvious abnormality, atraumatic Neck: no adenopathy, supple, symmetrical, trachea midline and thyroid normal to inspection and palpation Lungs: clear to auscultation bilaterally Breasts: normal appearance, no masses or tenderness, No nipple retraction or dimpling, No nipple discharge or bleeding, No axillary or supraclavicular adenopathy Heart: regular rate and rhythm Abdomen: soft, non-tender; no masses, no organomegaly Extremities: extremities normal, atraumatic, no cyanosis or edema Skin: Skin color, texture, turgor normal. No rashes or lesions Lymph nodes: Cervical, supraclavicular, and axillary nodes normal. No abnormal inguinal nodes palpated Neurologic: Grossly normal  Pelvic: External genitalia:  no lesions              Urethra:  normal appearing urethra with no masses, tenderness or lesions              Bartholins and Skenes: normal  Vagina: normal appearing vagina with normal color and discharge, no lesions              Cervix: no lesions              Pap taken: {yes no:314532} Bimanual Exam:  Uterus:  normal size, contour, position, consistency, mobility, non-tender              Adnexa: no mass, fullness, tenderness              Rectal exam: {yes no:314532}.  Confirms.              Anus:  normal sphincter tone, no lesions  Chaperone was present for exam.  Assessment:   Well woman visit with normal exam.   Plan: Mammogram screening. Recommended self breast awareness. Pap and HR HPV as above. Guidelines for Calcium, Vitamin D, regular exercise program including  cardiovascular and weight bearing exercise.   Follow up annually and prn.   Additional counseling given.  {yes [id number]. _______ minutes face to face time of which over 50% was spent in counseling.    After visit summary provided.

## 2018-04-21 ENCOUNTER — Telehealth: Payer: Self-pay | Admitting: Obstetrics and Gynecology

## 2018-04-21 ENCOUNTER — Ambulatory Visit: Payer: Medicaid Other | Admitting: Obstetrics and Gynecology

## 2018-04-21 NOTE — Telephone Encounter (Signed)
Patient called yesterday afternoon and left a message cancelling her annual exam for today due to inclement weather. I called her back and left a message to call our office to reschedule. She is in 02 recall.

## 2018-04-21 NOTE — Telephone Encounter (Signed)
Thank you for the update!

## 2018-05-23 ENCOUNTER — Other Ambulatory Visit: Payer: Self-pay

## 2018-05-23 NOTE — Telephone Encounter (Signed)
Medication refill request: seasonale Last AEX:  04-15-17 Next AEX: not yet scheduled. Last MMG (if hormonal medication request): 5/17 neg Refill authorized: left message for patient to call & schedule yearly exam

## 2018-05-24 NOTE — Telephone Encounter (Signed)
rx denied with message telling patient to call & schedule yearly exam.

## 2018-05-24 NOTE — Telephone Encounter (Signed)
Left message for call back.

## 2018-11-01 ENCOUNTER — Other Ambulatory Visit: Payer: Self-pay | Admitting: Obstetrics and Gynecology

## 2018-11-01 DIAGNOSIS — Z1231 Encounter for screening mammogram for malignant neoplasm of breast: Secondary | ICD-10-CM

## 2018-12-15 ENCOUNTER — Ambulatory Visit: Payer: BLUE CROSS/BLUE SHIELD

## 2019-01-24 ENCOUNTER — Ambulatory Visit: Payer: BLUE CROSS/BLUE SHIELD

## 2019-02-05 NOTE — Progress Notes (Signed)
38 y.o. G38P2002 [name] here for annual exam.    Stopped birth control pills 6 months ago.  She was worried about blood clots. Her cramping is not significant.  She has some rectal discomfort.  She is having stress incontinence also.  Having fecal incontinence and also constipation.  Wearing pads.  New dx of gallstones and fatty liver.  She will see a Psychologist, sport and exercise next week.  Saw cardiology for heart fluttering and chest pain.  She wore a monitor and will do an ECHO. Normal cholesterol and blood sugar.   She has hip dysplasia and this limited her exercise.  Children are 10 and 12.  New puppy.   PCP: Roe Coombs, PA-C     Patient's last menstrual period was 01/13/2019 (exact date).     Period Cycle (Days): 30 Period Duration (Days): 5 days Period Pattern: Regular Menstrual Flow: (light to moderate) Menstrual Control: Maxi pad Dysmenorrhea: (!) Moderate Dysmenorrhea Symptoms: Cramping, Diarrhea(??hemorrhoids)     Sexually active: No.  The current method of family planning is none--pt.is a widow.    Exercising: No.  The patient does not participate in regular exercise at present. Smoker:  no  Health Maintenance: Pap: 11-28-15 Neg:Neg HR HPV.  History of abnormal Pap:  no MMG: 08/06/15 w/ [place]  -- BIRADS 2 benign.  [facility] will reschedule at [facility]. Colonoscopy:  never BMD:   n/a  Result  n/a TDaP:  07-10-15 Gardasil:   No.  Declines.  HIV:12-09-15 NR Hep C:12-09-15 Neg Screening Labs:  PCP.  Flu vaccine:  Recommended.    reports that she has never smoked. She has never used smokeless tobacco. She reports current alcohol use of about 2.0 standard drinks of alcohol per week. She reports that she does not use drugs.  Past Medical History:  Diagnosis Date  . Abnormal EKG   . Anxiety   . Coccyx pain    --?broken during childbirth  . Depression   . Fatty liver 2020  . Gallstones 2020  . H/O cervical polypectomy   . Seasonal allergies   . Urinary  incontinence     Past Surgical History:  Procedure Laterality Date  . arthroscopic knee Right 2003    Current Outpatient Medications  Medication Sig Dispense Refill  . acetaminophen (TYLENOL) 500 MG tablet Take 500 mg by mouth every 6 (six) hours as needed.    . cetirizine (ZYRTEC) 10 MG tablet Take 10 mg by mouth daily.    . meloxicam (MOBIC) 15 MG tablet Take 15 mg by mouth daily as needed for pain.   3  . Multiple Vitamin (MULTIVITAMIN) capsule Take 1 capsule by mouth daily.    Marland Kitchen FLUoxetine (PROZAC) 40 MG capsule Take 1 capsule by mouth daily.     No current facility-administered medications for this visit.     Family History  Problem Relation Age of Onset  . Breast cancer Maternal Grandmother 75       dec from small bowel obstruction  . Thyroid disease Mother        hypothyroidism  . Hypertension Father   . Cancer Father 47       lung cancer  . Hypertension Paternal Grandfather     Review of Systems  Gastrointestinal:       Hemorrhoids  Genitourinary:       Pain after  urinating on different occasions.  All other systems reviewed and are negative.   Exam:   BP 110/76 (Cuff Size: Large)   Pulse 90  Temp 97.7 F (36.5 C) (Temporal)   Resp 20   Ht 5\' 7"  (1.702 m)   Wt 227 lb (103 kg)   LMP 01/13/2019 (Exact Date)   BMI 35.55 kg/m     General appearance: alert, cooperative and appears stated age Head: normocephalic, without obvious abnormality, atraumatic Neck: no adenopathy, supple, symmetrical, trachea midline and thyroid normal to inspection and palpation Lungs: clear to auscultation bilaterally Breasts: normal appearance, no masses or tenderness, No nipple retraction or dimpling, No nipple discharge or bleeding, No axillary adenopathy Heart: regular rate and rhythm Abdomen: soft, non-tender; no masses, no organomegaly Extremities: extremities normal, atraumatic, no cyanosis or edema Skin: skin color, texture, turgor normal. No rashes or lesions Lymph  nodes: cervical, supraclavicular, and axillary nodes normal. Neurologic: grossly normal  Pelvic: External genitalia:  no lesions              No abnormal inguinal nodes palpated.              Urethra:  normal appearing urethra with no masses, tenderness or lesions              Bartholins and Skenes: normal                 Vagina: normal appearing vagina with normal color and discharge, no lesions              Cervix: no lesions              Pap taken: No. Bimanual Exam:  Uterus:  normal size, contour, position, consistency, mobility, non-tender              Adnexa: no mass, fullness, tenderness              Rectal exam: Yes.  .  Confirms.              Anus:  normal sphincter tone, no lesions  Chaperone was present for exam.  Assessment:   Well woman visit with normal exam. Urinary and fecal incontinence.  Hx prolapse.  Plan: Mammogram screening discussed. Self breast awareness reviewed. Pap and HR HPV in 2022. Guidelines for Calcium, Vitamin D, regular exercise program including cardiovascular and weight bearing exercise. Physical therapy, Impressa, weight loss and Metamucil discussed. I offered to refer to a colon and rectal surgeon for her fecal incontinence.  She will let me know if she wishes for further care including surgery for her pelvic floor issues.  Rx for Micronor. 3 packs, 3 refills.   Instructed in use.  Follow up annually and prn.   After visit summary provided.

## 2019-02-06 ENCOUNTER — Other Ambulatory Visit: Payer: Self-pay

## 2019-02-06 ENCOUNTER — Encounter: Payer: Self-pay | Admitting: Obstetrics and Gynecology

## 2019-02-06 ENCOUNTER — Ambulatory Visit (INDEPENDENT_AMBULATORY_CARE_PROVIDER_SITE_OTHER): Payer: BC Managed Care – PPO | Admitting: Obstetrics and Gynecology

## 2019-02-06 VITALS — BP 110/76 | HR 90 | Temp 97.7°F | Resp 20 | Ht 67.0 in | Wt 227.0 lb

## 2019-02-06 DIAGNOSIS — Z01419 Encounter for gynecological examination (general) (routine) without abnormal findings: Secondary | ICD-10-CM

## 2019-02-06 MED ORDER — NORETHINDRONE 0.35 MG PO TABS: 1.0000 | ORAL_TABLET | Freq: Every day | ORAL | 3 refills | Status: DC

## 2019-02-06 NOTE — Patient Instructions (Signed)

## 2019-09-21 ENCOUNTER — Other Ambulatory Visit: Payer: Self-pay | Admitting: Obstetrics and Gynecology

## 2019-09-21 ENCOUNTER — Ambulatory Visit
Admission: RE | Admit: 2019-09-21 | Discharge: 2019-09-21 | Disposition: A | Discharge status: Home / Self Care | Payer: BLUE CROSS/BLUE SHIELD | Source: Ambulatory Visit | Attending: Obstetrics and Gynecology | Admitting: Obstetrics and Gynecology

## 2019-09-21 ENCOUNTER — Other Ambulatory Visit: Payer: Self-pay

## 2019-09-21 DIAGNOSIS — N63 Unspecified lump in unspecified breast: Secondary | ICD-10-CM

## 2019-09-21 DIAGNOSIS — Z1231 Encounter for screening mammogram for malignant neoplasm of breast: Secondary | ICD-10-CM

## 2019-09-22 ENCOUNTER — Telehealth: Payer: Self-pay | Admitting: Obstetrics and Gynecology

## 2019-09-22 NOTE — Telephone Encounter (Signed)
Please have patient make an appointment with me for evaluation of a breast lump.   I received a request to sign an order for diagnostic breast imaging due to a left breast lump.  She had a screening mammogram cancelled due to this.  We can reschedule her imaging after I see her.

## 2019-09-24 ENCOUNTER — Ambulatory Visit (INDEPENDENT_AMBULATORY_CARE_PROVIDER_SITE_OTHER): Payer: 59 | Admitting: Obstetrics and Gynecology

## 2019-09-24 ENCOUNTER — Encounter: Payer: Self-pay | Admitting: Obstetrics and Gynecology

## 2019-09-24 ENCOUNTER — Other Ambulatory Visit: Payer: Self-pay

## 2019-09-24 ENCOUNTER — Telehealth: Payer: Self-pay | Admitting: Obstetrics and Gynecology

## 2019-09-24 VITALS — BP 110/72 | HR 66 | Ht 66.5 in | Wt 226.0 lb

## 2019-09-24 DIAGNOSIS — N644 Mastodynia: Secondary | ICD-10-CM

## 2019-09-24 NOTE — Progress Notes (Signed)
GYNECOLOGY  VISIT   HPI: 39 y.o.   Widowed  Caucasian  female   G2P2002 with Patient's last menstrual period was 09/17/2019 (exact date).   here for left breast mass x 2-3 weeks. It's tender to touch. No redness or fever.   Mass is the size of a pea.  Difficult to find now per patient.   Saw her PCP with rectal bleeding and pain after bowel movements.  She will see a GI.   GYNECOLOGIC HISTORY: Patient's last menstrual period was 09/17/2019 (exact date). Contraception: Micronor Menopausal hormone therapy:  n/a Last mammogram: 08-06-15 w/Bil US--BiRads2 benign Last pap smear:11-28-15 Neg:Neg HR HPV        OB History    Gravida  2   Para  2   Term  2   Preterm      AB      Living  2     SAB      TAB      Ectopic      Multiple      Live Births                 There are no problems to display for this patient.   Past Medical History:  Diagnosis Date  . Abnormal EKG   . Anxiety   . Coccyx pain    --?broken during childbirth  . Depression   . Fatty liver 2020  . Gallstones 2020  . H/O cervical polypectomy   . Seasonal allergies   . Urinary incontinence     Past Surgical History:  Procedure Laterality Date  . arthroscopic knee Right 2003    Current Outpatient Medications  Medication Sig Dispense Refill  . acetaminophen (TYLENOL) 500 MG tablet Take 500 mg by mouth every 6 (six) hours as needed.    . celecoxib (CELEBREX) 200 MG capsule Take 200 mg by mouth daily.    . cetirizine (ZYRTEC) 10 MG tablet Take 10 mg by mouth daily.    Marland Kitchen gabapentin (NEURONTIN) 100 MG capsule Take 100 mg by mouth at bedtime.    . Milk Thistle 1000 MG CAPS Take by mouth.    . Multiple Vitamin (MULTIVITAMIN) capsule Take 1 capsule by mouth daily.    . norethindrone (MICRONOR) 0.35 MG tablet Take 1 tablet (0.35 mg total) by mouth daily. 3 Package 3  . rOPINIRole (REQUIP) 0.5 MG tablet Take 1-2 tablets by mouth at bedtime as needed.    Marland Kitchen FLUoxetine (PROZAC) 40 MG capsule Take  1 capsule by mouth daily.     No current facility-administered medications for this visit.     ALLERGIES: Patient has no known allergies.  Family History  Problem Relation Age of Onset  . Breast cancer Maternal Grandmother 80       dec from small bowel obstruction  . Thyroid disease Mother        hypothyroidism  . Hypertension Father   . Cancer Father 74       lung cancer  . Hypertension Paternal Grandfather     Social History   Socioeconomic History  . Marital status: Widowed    Spouse name: Not on file  . Number of children: Not on file  . Years of education: Not on file  . Highest education level: Not on file  Occupational History  . Not on file  Tobacco Use  . Smoking status: Never Smoker  . Smokeless tobacco: Never Used  Vaping Use  . Vaping Use: Never used  Substance and  Sexual Activity  . Alcohol use: Yes    Alcohol/week: 2.0 standard drinks    Types: 2 Glasses of wine per week  . Drug use: No  . Sexual activity: Not Currently    Partners: Male    Comment: pt. is a widow  Other Topics Concern  . Not on file  Social History Narrative  . Not on file   Social Determinants of Health   Financial Resource Strain:   . Difficulty of Paying Living Expenses:   Food Insecurity:   . Worried About Programme researcher, broadcasting/film/video in the Last Year:   . Barista in the Last Year:   Transportation Needs:   . Freight forwarder (Medical):   Marland Kitchen Lack of Transportation (Non-Medical):   Physical Activity:   . Days of Exercise per Week:   . Minutes of Exercise per Session:   Stress:   . Feeling of Stress :   Social Connections:   . Frequency of Communication with Friends and Family:   . Frequency of Social Gatherings with Friends and Family:   . Attends Religious Services:   . Active Member of Clubs or Organizations:   . Attends Banker Meetings:   Marland Kitchen Marital Status:   Intimate Partner Violence:   . Fear of Current or Ex-Partner:   . Emotionally  Abused:   Marland Kitchen Physically Abused:   . Sexually Abused:     Review of Systems  All other systems reviewed and are negative.   PHYSICAL EXAMINATION:    BP 110/72 (Cuff Size: Large)   Pulse 66   Ht 5' 6.5" (1.689 m)   Wt 226 lb (102.5 kg)   LMP 09/17/2019 (Exact Date)   BMI 35.93 kg/m     General appearance: alert, cooperative and appears stated age   Breasts: normal appearance, no masses or tenderness, No nipple retraction or dimpling, No nipple discharge or bleeding, No supraclavicular adenopathy  Chaperone was present for exam.  ASSESSMENT  Breast mass resolved.   PLAN  Ok for screening mammogram. Patient is ok with this plan.

## 2019-09-24 NOTE — Telephone Encounter (Signed)
Please cancel the patient's diagnostic mammogram plan.  She can do a screening mammogram.  The date and time will need to be confirmed with her.   Her breast lump is gone, and she and I cannot find it today.

## 2019-09-24 NOTE — Telephone Encounter (Signed)
Spoke with patient. OV scheduled for today at 4:30pm with Dr. Edward Jolly.  Covid 19 prescreen completed, precautions reviewed.   Last AEX 02/06/19  Routing to provider for final review. Patient is agreeable to disposition. Will close encounter.

## 2019-09-25 ENCOUNTER — Other Ambulatory Visit: Payer: Self-pay | Admitting: Obstetrics and Gynecology

## 2019-09-25 DIAGNOSIS — Z1231 Encounter for screening mammogram for malignant neoplasm of breast: Secondary | ICD-10-CM

## 2019-09-25 NOTE — Telephone Encounter (Signed)
Spoke with Lequita Halt at [facility].  Orders cancelled for Bilateral DX MMG.  Patient scheduled for 3D screening MMG on 10/01/19 at 4:20pm, arrive at 4pm.   Call to patient, left detailed message on mobile number, name identified on voicemail, ok per dpr. Advised of appt date and time for screening MMG, instructed patient to contact [facility] if any changes need to be made to appt date/time. Return call to office if any additional questions.   Routing to provider for final review. Patient is agreeable to disposition. Will close encounter.

## 2019-10-01 ENCOUNTER — Ambulatory Visit: Payer: 59

## 2019-10-22 ENCOUNTER — Other Ambulatory Visit: Payer: Self-pay

## 2019-10-22 ENCOUNTER — Ambulatory Visit
Admission: RE | Admit: 2019-10-22 | Discharge: 2019-10-22 | Disposition: A | Discharge status: Home / Self Care | Payer: 59 | Source: Ambulatory Visit | Attending: Obstetrics and Gynecology | Admitting: Obstetrics and Gynecology

## 2019-10-22 DIAGNOSIS — Z1231 Encounter for screening mammogram for malignant neoplasm of breast: Secondary | ICD-10-CM

## 2020-01-12 ENCOUNTER — Other Ambulatory Visit: Payer: Self-pay | Admitting: Obstetrics and Gynecology

## 2020-01-14 NOTE — Telephone Encounter (Signed)
Medication refill request: POP  Last AEX:  02-06-2019 BS Next AEX: 02-11-20  Last MMG (if hormonal medication request): 10-22-19 density C/BIRADS 1 negative  Refill authorized: Today, please advise.   Medication pended for #28, 0RF. Please refill if appropriate.

## 2020-01-28 HISTORY — PX: REPLACEMENT TOTAL HIP W/  RESURFACING IMPLANTS: SUR1222

## 2020-01-29 DIAGNOSIS — Z96642 Presence of left artificial hip joint: Secondary | ICD-10-CM | POA: Insufficient documentation

## 2020-02-11 ENCOUNTER — Encounter: Payer: BLUE CROSS/BLUE SHIELD | Admitting: Obstetrics and Gynecology

## 2020-02-11 ENCOUNTER — Other Ambulatory Visit: Payer: Self-pay | Admitting: Obstetrics and Gynecology

## 2020-02-11 NOTE — Progress Notes (Signed)
Opened in error.  No visit performed on this date.

## 2020-02-25 ENCOUNTER — Ambulatory Visit: Payer: 59 | Admitting: Nurse Practitioner

## 2020-02-28 ENCOUNTER — Ambulatory Visit: Payer: 59 | Admitting: Nurse Practitioner

## 2020-03-10 ENCOUNTER — Other Ambulatory Visit: Payer: Self-pay

## 2020-03-10 ENCOUNTER — Encounter: Payer: Self-pay | Admitting: Obstetrics and Gynecology

## 2020-03-10 ENCOUNTER — Ambulatory Visit (INDEPENDENT_AMBULATORY_CARE_PROVIDER_SITE_OTHER): Payer: 59 | Admitting: Obstetrics and Gynecology

## 2020-03-10 VITALS — BP 112/64 | HR 90 | Ht 67.0 in | Wt 241.0 lb

## 2020-03-10 DIAGNOSIS — N814 Uterovaginal prolapse, unspecified: Secondary | ICD-10-CM

## 2020-03-10 DIAGNOSIS — Z01419 Encounter for gynecological examination (general) (routine) without abnormal findings: Secondary | ICD-10-CM | POA: Diagnosis not present

## 2020-03-10 MED ORDER — NORETHINDRONE 0.35 MG PO TABS: 1.0000 | ORAL_TABLET | Freq: Every day | ORAL | 3 refills | Status: DC

## 2020-03-10 NOTE — Progress Notes (Signed)
39 y.o. G53P2002 [name] here for annual exam.    Ran out of Micronor 2 days ago.  Wants to continue.   Had a left hip replacement.  Has improved flexibility.  Having back issues.   Still with urinary leakage.  Leaks after voiding.  No leakage with lifting.  May leak with sneeze.  No leak for no reason that she knows of.  Voiding frequency is not disruptive.  Wearing a pad all day.  Some fecal soiling, but is better.  Not certain if she is having more prolapse.   Did not do Covid vaccine.   PCP: Miranda Gobble, PA-C  Patient's last menstrual period was 02/25/2020 (approximate).     Period Cycle (Days): 30 Period Duration (Days): 5 Period Pattern: Regular Menstrual Flow: Moderate Menstrual Control: Maxi pad Menstrual Control Change Freq (Hours): changes pad twice daily Dysmenorrhea: (!) Moderate Dysmenorrhea Symptoms: Cramping,Nausea,Diarrhea     Sexually active: No.  The current method of family planning is none--widowed.  On Micronor.  Exercising: Yes.    has been doing physical therapy for hip and back Smoker:  no  Health Maintenance: Pap: 11-28-15 Neg:Neg HR HPV History of abnormal Pap:  no MMG:  10-22-19 3D/Neg/density C/Birads1 Colonoscopy: 11/2019 done for bleeding and pain;normal except for hemorrhoids and diverticulosis BMD:   n/a  Result  n/a TDaP:  07-10-15 Gardasil:   Declined HIV: 12-09-15 NR Hep C: 12-09-15 Neg Screening Labs:  PCP.   reports that she has never smoked. She has never used smokeless tobacco. She reports current alcohol use of about 2.0 standard drinks of alcohol per week. She reports that she does not use drugs.  Past Medical History:  Diagnosis Date  . Abnormal EKG   . Anxiety   . Arthritis   . Coccyx pain    --?broken during childbirth  . DDD (degenerative disc disease), lumbar   . Depression   . Fatty liver 2020  . Gallstones 2020  . H/O cervical polypectomy   . Seasonal allergies   . Urinary incontinence      Past Surgical History:  Procedure Laterality Date  . arthroscopic knee Right 2003  . 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 Darnell Level    Current Outpatient Medications  Medication Sig Dispense Refill  . celecoxib (CELEBREX) 200 MG capsule Take 200 mg by mouth daily.    . cetirizine (ZYRTEC) 10 MG tablet Take 10 mg by mouth daily.    Marland Kitchen FLUoxetine (PROZAC) 40 MG capsule Take 1 tablet by mouth daily.    Marland Kitchen HYDROCORTISONE ACE, RECTAL, 30 MG SUPP Place 1 suppository rectally as needed.    . Multiple Vitamin (MULTIVITAMIN) capsule Take 1 capsule by mouth daily.    . norethindrone (MICRONOR) 0.35 MG tablet TAKE 1 TABLET BY MOUTH EVERY DAY 28 tablet 0   No current facility-administered medications for this visit.    Family History  Problem Relation Age of Onset  . Breast cancer Maternal Grandmother 93       dec from small bowel obstruction  . Thyroid disease Mother        hypothyroidism  . Hypertension Father   . Cancer Father 28       lung cancer  . Hypertension Paternal Grandfather     Review of Systems  Genitourinary:       Incontinence  All other systems reviewed and are negative.   Exam:   BP 112/64 (Cuff Size: Large)   Pulse  90   Ht 5\' 7"  (1.702 m)   Wt 241 lb (109.3 kg)   LMP 02/25/2020 (Approximate)   SpO2 99%   BMI 37.75 kg/m     General appearance: alert, cooperative and appears stated age Head: normocephalic, without obvious abnormality, atraumatic Neck: no adenopathy, supple, symmetrical, trachea midline and thyroid normal to inspection and palpation Lungs: clear to auscultation bilaterally Breasts: normal appearance, no masses or tenderness, No nipple retraction or dimpling, No nipple discharge or bleeding, No axillary adenopathy Heart: regular rate and rhythm Abdomen: soft, non-tender; no masses, no organomegaly Extremities: extremities normal, atraumatic, no cyanosis or edema Skin: skin color, texture, turgor normal. No  rashes or lesions Lymph nodes: cervical, supraclavicular, and axillary nodes normal. Neurologic: grossly normal  Pelvic: External genitalia:  no lesions              No abnormal inguinal nodes palpated.              Urethra:  normal appearing urethra with no masses, tenderness or lesions              Bartholins and Skenes: normal                 Vagina: normal appearing vagina with normal color and discharge, no lesions.  First degree uterine prolapse.               Cervix: no lesions.                Pap taken: No. Bimanual Exam:  Uterus:  normal size, contour, position, consistency, mobility, non-tender.  Strong Kegel.               Adnexa: no mass, fullness, tenderness              Rectal exam: Yes.   Confirms.              Anus:  normal sphincter tone, no lesions  Chaperone was present for exam.  Assessment:   Well woman visit with normal exam. Urinary and fecal incontinence.  Uterine prolapse.   Plan: Mammogram screening discussed. Self breast awareness reviewed. Pap and HR HPV 2022. Guidelines for Calcium, Vitamin D, regular exercise program including cardiovascular and weight bearing exercise. Kegel's.  Return for pessary fitting.  Follow up annually and prn.

## 2020-03-31 ENCOUNTER — Ambulatory Visit: Payer: 59 | Admitting: Obstetrics and Gynecology

## 2020-04-29 ENCOUNTER — Other Ambulatory Visit: Payer: Self-pay

## 2020-04-29 ENCOUNTER — Encounter: Payer: Self-pay | Admitting: Obstetrics and Gynecology

## 2020-04-29 ENCOUNTER — Telehealth: Payer: Self-pay | Admitting: Obstetrics and Gynecology

## 2020-04-29 ENCOUNTER — Ambulatory Visit (INDEPENDENT_AMBULATORY_CARE_PROVIDER_SITE_OTHER): Payer: 59 | Admitting: Obstetrics and Gynecology

## 2020-04-29 VITALS — BP 130/92 | HR 90 | Resp 14 | Ht 68.0 in | Wt 248.0 lb

## 2020-04-29 DIAGNOSIS — N814 Uterovaginal prolapse, unspecified: Secondary | ICD-10-CM

## 2020-04-29 DIAGNOSIS — R32 Unspecified urinary incontinence: Secondary | ICD-10-CM

## 2020-04-29 NOTE — Progress Notes (Signed)
GYNECOLOGY  VISIT   HPI: 40 y.o.   Widowed  Caucasian  female   G2P2002 with Patient's last menstrual period was 04/15/2020.   here for   Pessary fitting   Leaks urine after she stands up.  She cannot feel the leakage.  No real leak with cough, laugh, or sneeze.   She has first degree uterine prolapse.   GYNECOLOGIC HISTORY: Patient's last menstrual period was 04/15/2020. Contraception:  POP  Menopausal hormone therapy:  none Last mammogram:  10-22-19 density C/BIRADS 1 negative  Last pap smear:   11-28-15 negative, HR HPV negative         OB History    Gravida  2   Para  2   Term  2   Preterm      AB      Living  2     SAB      IAB      Ectopic      Multiple      Live Births                 There are no problems to display for this patient.   Past Medical History:  Diagnosis Date  . Abnormal EKG   . Anxiety   . Arthritis   . Coccyx pain    --?broken during childbirth  . DDD (degenerative disc disease), lumbar   . Depression   . Fatty liver 2020  . Gallstones 2020  . H/O cervical polypectomy   . Seasonal allergies   . Urinary incontinence     Past Surgical History:  Procedure Laterality Date  . arthroscopic knee Right 2003  . 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 Darnell Level    Current Outpatient Medications  Medication Sig Dispense Refill  . celecoxib (CELEBREX) 200 MG capsule Take 200 mg by mouth daily.    . cetirizine (ZYRTEC) 10 MG tablet Take 10 mg by mouth daily.    Marland Kitchen FLUoxetine (PROZAC) 40 MG capsule Take 1 tablet by mouth daily.    Marland Kitchen HYDROCORTISONE ACE, RECTAL, 30 MG SUPP Place 1 suppository rectally as needed.    . methocarbamol (ROBAXIN) 500 MG tablet Take by mouth.    . Multiple Vitamin (MULTIVITAMIN) capsule Take 1 capsule by mouth daily.    . norethindrone (MICRONOR) 0.35 MG tablet Take 1 tablet (0.35 mg total) by mouth daily. 84 tablet 3   No current facility-administered medications for  this visit.     ALLERGIES: Wound dressing adhesive  Family History  Problem Relation Age of Onset  . Breast cancer Maternal Grandmother 40       dec from small bowel obstruction  . Thyroid disease Mother        hypothyroidism  . Hypertension Father   . Cancer Father 9       lung cancer  . Hypertension Paternal Grandfather     Social History   Socioeconomic History  . Marital status: Widowed    Spouse name: Not on file  . Number of children: Not on file  . Years of education: Not on file  . Highest education level: Not on file  Occupational History  . Not on file  Tobacco Use  . Smoking status: Never Smoker  . Smokeless tobacco: Never Used  Vaping Use  . Vaping Use: Never used  Substance and Sexual Activity  . Alcohol use: Yes    Alcohol/week: 2.0 standard drinks    Types: 2 Glasses  of wine per week  . Drug use: No  . Sexual activity: Not Currently    Partners: Male    Comment: pt. is a widow  Other Topics Concern  . Not on file  Social History Narrative  . Not on file   Social Determinants of Health   Financial Resource Strain: Not on file  Food Insecurity: Not on file  Transportation Needs: Not on file  Physical Activity: Not on file  Stress: Not on file  Social Connections: Not on file  Intimate Partner Violence: Not on file    Review of Systems  All other systems reviewed and are negative.   PHYSICAL EXAMINATION:    BP (!) 130/92 (BP Location: Left Arm, Patient Position: Sitting, Cuff Size: Normal)   Pulse 90   Resp 14   Ht 5\' 8"  (1.727 m)   Wt 248 lb (112.5 kg)   LMP 04/15/2020   BMI 37.71 kg/m     General appearance: alert, cooperative and appears stated age  Pelvic: External genitalia:  no lesions              Urethra:  normal appearing urethra with no masses, tenderness or lesions              Bartholins and Skenes: normal                 Vagina: normal appearing vagina with normal color and discharge, no lesions              Cervix:  no lesions                Bimanual Exam:  Uterus:  normal size, contour, position, consistency, mobility, non-tender.  First degree uterine prolapse.               Adnexa: no mass, fullness, tenderness             Chaperone was present for exam.  ASSESSMENT  Urinary incontinence.  Uterine prolapse.   PLAN  #3 ring with support fitted and reasonably comfortable, so will order this.  #4 ring with support too big.  Return for placement of pessary and care instructions.    23 min  total time was spent for this patient encounter, including preparation, face-to-face counseling with the patient, coordination of care, and documentation of the encounter.

## 2020-04-29 NOTE — Telephone Encounter (Signed)
Please order a 3# ring with support pessary for my patient.   She will need an office visit when this arrives.   Thank you.

## 2020-04-30 NOTE — Telephone Encounter (Signed)
Spoke with patient. Advised pessary in office. Patient declines to schedule at this time, will return call later today to schedule OV.   #3 ring with support to [place], CMA.

## 2020-05-05 NOTE — Telephone Encounter (Signed)
Left message to call Naleah Kofoed, RN at [facility], [phone].  

## 2020-05-06 NOTE — Telephone Encounter (Signed)
Spoke with patient. Patient request 4pm appt. OV scheduled for 06/18/20 at 4pm with Dr. Edward Jolly.  Patient is agreeable to date and time.  Encounter closed.

## 2020-06-18 ENCOUNTER — Ambulatory Visit (INDEPENDENT_AMBULATORY_CARE_PROVIDER_SITE_OTHER): Payer: 59 | Admitting: Obstetrics and Gynecology

## 2020-06-18 ENCOUNTER — Other Ambulatory Visit: Payer: Self-pay

## 2020-06-18 ENCOUNTER — Encounter: Payer: Self-pay | Admitting: Obstetrics and Gynecology

## 2020-06-18 VITALS — BP 122/68 | HR 87 | Ht 66.5 in | Wt 248.0 lb

## 2020-06-18 DIAGNOSIS — N814 Uterovaginal prolapse, unspecified: Secondary | ICD-10-CM

## 2020-06-18 NOTE — Progress Notes (Signed)
GYNECOLOGY  VISIT   HPI: 40 y.o.   Widowed  Caucasian  female   G2P2002 with Patient's last menstrual period was 06/01/2020 (exact date).   here for pessary insertion.    Does have urinary incontinence after starting her voiding.   GYNECOLOGIC HISTORY: Patient's last menstrual period was 06/01/2020 (exact date). Contraception: POP Menopausal hormone therapy:  none Last mammogram:  10-22-19 density C/BIRADS 1 negative  Last pap smear: 11-28-15 negative, HR HPV negative         OB History    Gravida  2   Para  2   Term  2   Preterm      AB      Living  2     SAB      IAB      Ectopic      Multiple      Live Births                 There are no problems to display for this patient.   Past Medical History:  Diagnosis Date  . Abnormal EKG   . Anxiety   . Arthritis   . Coccyx pain    --?broken during childbirth  . DDD (degenerative disc disease), lumbar   . Depression   . Fatty liver 2020  . Gallstones 2020  . H/O cervical polypectomy   . Seasonal allergies   . Urinary incontinence     Past Surgical History:  Procedure Laterality Date  . arthroscopic knee Right 2003  . 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 Darnell Level    Current Outpatient Medications  Medication Sig Dispense Refill  . celecoxib (CELEBREX) 200 MG capsule Take 200 mg by mouth daily.    . cetirizine (ZYRTEC) 10 MG tablet Take 10 mg by mouth daily.    Marland Kitchen FLUoxetine (PROZAC) 40 MG capsule Take 1 tablet by mouth daily.    Marland Kitchen HYDROCORTISONE ACE, RECTAL, 30 MG SUPP Place 1 suppository rectally as needed.    . methocarbamol (ROBAXIN) 500 MG tablet Take by mouth.    . Multiple Vitamin (MULTIVITAMIN) capsule Take 1 capsule by mouth daily.    . norethindrone (MICRONOR) 0.35 MG tablet Take 1 tablet (0.35 mg total) by mouth daily. 84 tablet 3   No current facility-administered medications for this visit.     ALLERGIES: Wound dressing adhesive  Family History   Problem Relation Age of Onset  . Breast cancer Maternal Grandmother 38       dec from small bowel obstruction  . Thyroid disease Mother        hypothyroidism  . Hypertension Father   . Cancer Father 57       lung cancer  . Hypertension Paternal Grandfather     Social History   Socioeconomic History  . Marital status: Widowed    Spouse name: Not on file  . Number of children: Not on file  . Years of education: Not on file  . Highest education level: Not on file  Occupational History  . Not on file  Tobacco Use  . Smoking status: Never Smoker  . Smokeless tobacco: Never Used  Vaping Use  . Vaping Use: Never used  Substance and Sexual Activity  . Alcohol use: Yes    Alcohol/week: 2.0 standard drinks    Types: 2 Glasses of wine per week  . Drug use: No  . Sexual activity: Not Currently    Partners: Male  Comment: pt. is a widow  Other Topics Concern  . Not on file  Social History Narrative  . Not on file   Social Determinants of Health   Financial Resource Strain: Not on file  Food Insecurity: Not on file  Transportation Needs: Not on file  Physical Activity: Not on file  Stress: Not on file  Social Connections: Not on file  Intimate Partner Violence: Not on file    Review of Systems  All other systems reviewed and are negative.   PHYSICAL EXAMINATION:    BP 122/68 (Cuff Size: Large)   Pulse 87   Ht 5' 6.5" (1.689 m)   Wt 248 lb (112.5 kg)   LMP 06/01/2020 (Exact Date)   SpO2 100%   BMI 39.43 kg/m     General appearance: alert, cooperative and appears stated age  Pelvic: External genitalia:  no lesions              Urethra:  normal appearing urethra with no masses, tenderness or lesions              #3 ring with support placed in the vagina.  Pessary comfortable.              Chaperone was present for exam.  ASSESSMENT  Uterine prolapse.   PLAN  Pessary #3 ring with support, lot number [id number], exp 07/12/29, placed in vagina.  Patient  will return in 7 - 10 days for a pessary check and instruction for placement and removal.  Pessary precautions reviewed with the patient.    17 min  total time was spent for this patient encounter, including preparation, face-to-face counseling with the patient, coordination of care, and documentation of the encounter.

## 2020-06-18 NOTE — Patient Instructions (Signed)

## 2020-06-30 ENCOUNTER — Ambulatory Visit: Payer: 59 | Admitting: Obstetrics and Gynecology

## 2020-07-07 NOTE — Progress Notes (Signed)
GYNECOLOGY  VISIT   HPI: 40 y.o.   Widowed  Caucasian  female   G2P2002 with Patient's last menstrual period was 06/30/2020 (exact date).   here for pessary check.  Patient has a #3 ring with support which was placed on 06/18/20. She is not able to reach the pessary as it sits back far in the vagina.   Patient did leak urine with sneezing once.  States her urinary leakage is improved overall.  May leak less when she stands up after voiding.  No other leakage episodes with physical activity.  Wears a pad every day.   No unusual discharge or bleeding.   Does not note any change in prolapse symptoms, which she did not have prior to the pessary.   GYNECOLOGIC HISTORY: Patient's last menstrual period was 06/30/2020 (exact date). Contraception: POP Menopausal hormone therapy:  none Last mammogram: 10-22-19 density C/BIRADS 1 negative Last pap smear: 11-28-15 Neg:Neg HR HPV        OB History    Gravida  2   Para  2   Term  2   Preterm      AB      Living  2     SAB      IAB      Ectopic      Multiple      Live Births                 There are no problems to display for this patient.   Past Medical History:  Diagnosis Date  . Abnormal EKG   . Anxiety   . Arthritis   . Coccyx pain    --?broken during childbirth  . DDD (degenerative disc disease), lumbar   . Depression   . Fatty liver 2020  . Gallstones 2020  . H/O cervical polypectomy   . Seasonal allergies   . Urinary incontinence     Past Surgical History:  Procedure Laterality Date  . arthroscopic knee Right 2003  . 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 Darnell Level    Current Outpatient Medications  Medication Sig Dispense Refill  . celecoxib (CELEBREX) 200 MG capsule Take 200 mg by mouth daily.    . cetirizine (ZYRTEC) 10 MG tablet Take 10 mg by mouth daily.    Marland Kitchen FLUoxetine (PROZAC) 40 MG capsule Take 1 tablet by mouth daily.    Marland Kitchen HYDROCORTISONE ACE, RECTAL,  30 MG SUPP Place 1 suppository rectally as needed.    . methocarbamol (ROBAXIN) 500 MG tablet Take by mouth.    . Multiple Vitamin (MULTIVITAMIN) capsule Take 1 capsule by mouth daily.    . norethindrone (MICRONOR) 0.35 MG tablet Take 1 tablet (0.35 mg total) by mouth daily. 84 tablet 3   No current facility-administered medications for this visit.     ALLERGIES: Wound dressing adhesive  Family History  Problem Relation Age of Onset  . Breast cancer Maternal Grandmother 16       dec from small bowel obstruction  . Thyroid disease Mother        hypothyroidism  . Hypertension Father   . Cancer Father 25       lung cancer  . Hypertension Paternal Grandfather     Social History   Socioeconomic History  . Marital status: Widowed    Spouse name: Not on file  . Number of children: Not on file  . Years of education: Not on file  . Highest education  level: Not on file  Occupational History  . Not on file  Tobacco Use  . Smoking status: Never Smoker  . Smokeless tobacco: Never Used  Vaping Use  . Vaping Use: Never used  Substance and Sexual Activity  . Alcohol use: Yes    Alcohol/week: 2.0 standard drinks    Types: 2 Glasses of wine per week  . Drug use: No  . Sexual activity: Not Currently    Partners: Male    Comment: pt. is a widow  Other Topics Concern  . Not on file  Social History Narrative  . Not on file   Social Determinants of Health   Financial Resource Strain: Not on file  Food Insecurity: Not on file  Transportation Needs: Not on file  Physical Activity: Not on file  Stress: Not on file  Social Connections: Not on file  Intimate Partner Violence: Not on file    Review of Systems  All other systems reviewed and are negative.   PHYSICAL EXAMINATION:    BP 110/70 (Cuff Size: Large)   Pulse 71   Ht 5' 6.5" (1.689 m)   Wt 248 lb (112.5 kg)   LMP 06/30/2020 (Exact Date)   SpO2 99%   BMI 39.43 kg/m     General appearance: alert, cooperative and  appears stated age  Pelvic: External genitalia:  no lesions              Urethra:  normal appearing urethra with no masses, tenderness or lesions              Bartholins and Skenes: normal                 Vagina: normal appearing vagina with normal color and discharge, no lesions              Cervix: no lesions                Bimanual Exam:  Uterus:  normal size, contour, position, consistency, mobility, non-tender              Adnexa: no mass, fullness, tenderness           Pessary was removed, cleansed and replaced. The pessary is at the top of the vagina.   Chaperone was present for exam.  ASSESSMENT  Uterine prolapse.  Urinary incontinence, improved.  Pessary maintenance.  PLAN  Patient declines to try to remove or place the pessary today.  She will continue with the pessary and return in 2 months for reassessment.  She knows she can call sooner if she is having problems with the pessary and wishes for removal.   22 min total time was spent for this patient encounter, including preparation, face-to-face counseling with the patient, coordination of care, and documentation of the encounter.

## 2020-07-08 ENCOUNTER — Other Ambulatory Visit: Payer: Self-pay

## 2020-07-08 ENCOUNTER — Encounter: Payer: Self-pay | Admitting: Obstetrics and Gynecology

## 2020-07-08 ENCOUNTER — Ambulatory Visit (INDEPENDENT_AMBULATORY_CARE_PROVIDER_SITE_OTHER): Payer: 59 | Admitting: Obstetrics and Gynecology

## 2020-07-08 VITALS — BP 110/70 | HR 71 | Ht 66.5 in | Wt 248.0 lb

## 2020-07-08 DIAGNOSIS — Z4689 Encounter for fitting and adjustment of other specified devices: Secondary | ICD-10-CM

## 2020-07-08 DIAGNOSIS — N814 Uterovaginal prolapse, unspecified: Secondary | ICD-10-CM | POA: Diagnosis not present

## 2020-07-14 ENCOUNTER — Encounter: Payer: Self-pay | Admitting: Obstetrics and Gynecology

## 2020-07-14 ENCOUNTER — Other Ambulatory Visit: Payer: Self-pay

## 2020-07-14 ENCOUNTER — Ambulatory Visit (INDEPENDENT_AMBULATORY_CARE_PROVIDER_SITE_OTHER): Payer: 59 | Admitting: Obstetrics and Gynecology

## 2020-07-14 VITALS — BP 118/66 | HR 78 | Temp 98.4°F | Ht 66.25 in | Wt 248.0 lb

## 2020-07-14 DIAGNOSIS — R350 Frequency of micturition: Secondary | ICD-10-CM | POA: Diagnosis not present

## 2020-07-14 DIAGNOSIS — N814 Uterovaginal prolapse, unspecified: Secondary | ICD-10-CM

## 2020-07-14 LAB — URINALYSIS, COMPLETE W/RFL CULTURE
Bacteria, UA: NONE SEEN /HPF
Bilirubin Urine: NEGATIVE
Glucose, UA: NEGATIVE
Hgb urine dipstick: NEGATIVE
Hyaline Cast: NONE SEEN /LPF
Ketones, ur: NEGATIVE
Leukocyte Esterase: NEGATIVE
Nitrites, Initial: NEGATIVE
Protein, ur: NEGATIVE
RBC / HPF: NONE SEEN /HPF (ref 0–2)
Specific Gravity, Urine: 1.015 (ref 1.001–1.035)
WBC, UA: NONE SEEN /HPF (ref 0–5)
pH: 7.5 (ref 5.0–8.0)

## 2020-07-14 LAB — NO CULTURE INDICATED

## 2020-07-14 NOTE — Progress Notes (Signed)
GYNECOLOGY  VISIT   HPI: 40 y.o.   Widowed  Caucasian  female   G2P2002 with Patient's last menstrual period was 06/30/2020 (exact date).   here for pessary removal. Patient having some abdominal cramping and urinary frequency. She was unable to remove the pessary on her own.   No dysuria. Having frequency and not voiding a lot.   No vaginal bleeding.   Notes some odor.  No vaginal discharge.   No fever.   No diarrhea.   She notes that she is having urinary odor in her underwear and difficulty cleaning after a BM.  She is not perceiving the leakage.  She can leak with standing up.  No leak with sneeze until she tried the pessary, when this occurred once.  No leak with cough or exercise.  No key in lock syndrome.  No urgency with leakage No sudden onset of leakage.   GYNECOLOGIC HISTORY: Patient's last menstrual period was 06/30/2020 (exact date). Contraception: POP Menopausal hormone therapy:  none Last mammogram: 10-22-19 density C/BIRADS 1 negative Last pap smear: 11-28-15 Neg:Neg HR HPV        OB History    Gravida  2   Para  2   Term  2   Preterm      AB      Living  2     SAB      IAB      Ectopic      Multiple      Live Births                 There are no problems to display for this patient.   Past Medical History:  Diagnosis Date  . Abnormal EKG   . Anxiety   . Arthritis   . Coccyx pain    --?broken during childbirth  . DDD (degenerative disc disease), lumbar   . Depression   . Fatty liver 2020  . Gallstones 2020  . H/O cervical polypectomy   . Seasonal allergies   . Urinary incontinence     Past Surgical History:  Procedure Laterality Date  . arthroscopic knee Right 2003  . 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 Darnell Level    Current Outpatient Medications  Medication Sig Dispense Refill  . celecoxib (CELEBREX) 200 MG capsule Take 200 mg by mouth daily.    . cetirizine (ZYRTEC) 10 MG  tablet Take 10 mg by mouth daily.    Marland Kitchen FLUoxetine (PROZAC) 40 MG capsule Take 1 tablet by mouth daily.    Marland Kitchen HYDROCORTISONE ACE, RECTAL, 30 MG SUPP Place 1 suppository rectally as needed.    . methocarbamol (ROBAXIN) 500 MG tablet Take by mouth.    . Multiple Vitamin (MULTIVITAMIN) capsule Take 1 capsule by mouth daily.    . norethindrone (MICRONOR) 0.35 MG tablet Take 1 tablet (0.35 mg total) by mouth daily. 84 tablet 3   No current facility-administered medications for this visit.     ALLERGIES: Wound dressing adhesive  Family History  Problem Relation Age of Onset  . Breast cancer Maternal Grandmother 63       dec from small bowel obstruction  . Thyroid disease Mother        hypothyroidism  . Hypertension Father   . Cancer Father 8       lung cancer  . Hypertension Paternal Grandfather     Social History   Socioeconomic History  . Marital status: Widowed  Spouse name: Not on file  . Number of children: Not on file  . Years of education: Not on file  . Highest education level: Not on file  Occupational History  . Not on file  Tobacco Use  . Smoking status: Never Smoker  . Smokeless tobacco: Never Used  Vaping Use  . Vaping Use: Never used  Substance and Sexual Activity  . Alcohol use: Yes    Alcohol/week: 2.0 standard drinks    Types: 2 Glasses of wine per week  . Drug use: No  . Sexual activity: Not Currently    Partners: Male    Comment: pt. is a widow  Other Topics Concern  . Not on file  Social History Narrative  . Not on file   Social Determinants of Health   Financial Resource Strain: Not on file  Food Insecurity: Not on file  Transportation Needs: Not on file  Physical Activity: Not on file  Stress: Not on file  Social Connections: Not on file  Intimate Partner Violence: Not on file    Review of Systems  Genitourinary: Positive for frequency and pelvic pain.  All other systems reviewed and are negative.   PHYSICAL EXAMINATION:    BP  118/66 (Cuff Size: Large)   Pulse 78   Temp 98.4 F (36.9 C) (Oral)   Ht 5' 6.25" (1.683 m)   Wt 248 lb (112.5 kg)   LMP 06/30/2020 (Exact Date)   SpO2 99%   BMI 39.73 kg/m     General appearance: alert, cooperative and appears stated age   Pelvic: External genitalia:  no lesions              Urethra:  normal appearing urethra with no masses, tenderness or lesions              Bartholins and Skenes: normal                 Vagina: normal appearing vagina with normal color and discharge, no lesions              Cervix: no lesions.  Pessary is very much at the top of the vagina.                 Bimanual Exam:  Uterus:  normal size, contour, position, consistency, mobility, non-tender              Adnexa: no mass, fullness, tenderness         Pessary removed with assistance of a ring forceps, cleansed and given to patient in a biobag.   Chaperone was present for exam.  ASSESSMENT  Urinary frequency.  Overactive bladder.  Stress incontinence.  Uterine prolapse.   PLAN  Urinalysis:  NS WBC, NS RBC, NS bacteria, 0 -5 squams.  Options for care:  Observation with Kegel's and avoidance of bladder irritants, pelvic floor therapy, medication for overactive bladder, potential midurethral sling.  Do a pyridium test at home.  She will take over the counter AZO standard and wear a pad and observe for staining on her pad.  If she is having leakage without warning, will do a trial of Ditropan XL or Myrbetriq.  She will not continue with pessary use.  FU prn.  30 min  total time was spent for this patient encounter, including preparation, face-to-face counseling with the patient, coordination of care, and documentation of the encounter.

## 2020-07-14 NOTE — Patient Instructions (Signed)
Overactive Bladder, Adult  Overactive bladder is a condition in which a person has a sudden and frequent need to urinate. A person might also leak urine if he or she cannot get to the bathroom fast enough (urinary incontinence). Sometimes, symptoms can interfere with work or social activities. What are the causes? Overactive bladder is associated with poor nerve signals between your bladder and your brain. Your bladder may get the signal to empty before it is full. You may also have very sensitive muscles that make your bladder squeeze too soon. This condition may also be caused by other factors, such as:  Medical conditions: ? Urinary tract infection. ? Infection of nearby tissues. ? Prostate enlargement. ? Bladder stones, inflammation, or tumors. ? Diabetes. ? Muscle or nerve weakness, especially from these conditions:  A spinal cord injury.  Stroke.  Multiple sclerosis.  Parkinson's disease.  Other causes: ? Surgery on the uterus or urethra. ? Drinking too much caffeine or alcohol. ? Certain medicines, especially those that eliminate extra fluid in the body (diuretics). ? Constipation. What increases the risk? You may be at greater risk for overactive bladder if you:  Are an older adult.  Smoke.  Are going through menopause.  Have prostate problems.  Have a neurological disease, such as stroke, dementia, Parkinson's disease, or multiple sclerosis (MS).  Eat or drink alcohol, spicy food, caffeine, and other things that irritate the bladder.  Are overweight or obese. What are the signs or symptoms? Symptoms of this condition include a sudden, strong urge to urinate. Other symptoms include:  Leaking urine.  Urinating 8 or more times a day.  Waking up to urinate 2 or more times overnight. How is this diagnosed? This condition may be diagnosed based on:  Your symptoms and medical history.  A physical exam.  Blood or urine tests to check for possible causes,  such as infection. You may also need to see a health care provider who specializes in urinary tract problems. This is called a urologist. How is this treated? Treatment for overactive bladder depends on the cause of your condition and whether it is mild or severe. Treatment may include:  Bladder training, such as: ? Learning to control the urge to urinate by following a schedule to urinate at regular intervals. ? Doing Kegel exercises to strengthen the pelvic floor muscles that support your bladder.  Special devices, such as: ? Biofeedback. This uses sensors to help you become aware of your body's signals. ? Electrical stimulation. This uses electrodes placed inside the body (implanted) or outside the body. These electrodes send gentle pulses of electricity to strengthen the nerves or muscles that control the bladder. ? Women may use a plastic device, called a pessary, that fits into the vagina and supports the bladder.  Medicines, such as: ? Antibiotics to treat bladder infection. ? Antispasmodics to stop the bladder from releasing urine at the wrong time. ? Tricyclic antidepressants to relax bladder muscles. ? Injections of botulinum toxin type A directly into the bladder tissue to relax bladder muscles.  Surgery, such as: ? A device may be implanted to help manage the nerve signals that control urination. ? An electrode may be implanted to stimulate electrical signals in the bladder. ? A procedure may be done to change the shape of the bladder. This is done only in very severe cases. Follow these instructions at home: Eating and drinking  Make diet or lifestyle changes recommended by your health care provider. These may include: ? Drinking fluids   throughout the day and not only with meals. ? Cutting down on caffeine or alcohol. ? Eating a healthy and balanced diet to prevent constipation. This may include:  Choosing foods that are high in fiber, such as beans, whole grains, and  fresh fruits and vegetables.  Limiting foods that are high in fat and processed sugars, such as fried and sweet foods.   Lifestyle  Lose weight if needed.  Do not use any products that contain nicotine or tobacco. These include cigarettes, chewing tobacco, and vaping devices, such as e-cigarettes. If you need help quitting, ask your health care provider.   General instructions  Take over-the-counter and prescription medicines only as told by your health care provider.  If you were prescribed an antibiotic medicine, take it as told by your health care provider. Do not stop taking the antibiotic even if you start to feel better.  Use any implants or pessary as told by your health care provider.  If needed, wear pads to absorb urine leakage.  Keep a log to track how much and when you drink, and when you need to urinate. This will help your health care provider monitor your condition.  Keep all follow-up visits. This is important. Contact a health care provider if:  You have a fever or chills.  Your symptoms do not get better with treatment.  Your pain and discomfort get worse.  You have more frequent urges to urinate. Get help right away if:  You are not able to control your bladder. Summary  Overactive bladder refers to a condition in which a person has a sudden and frequent need to urinate.  Several conditions may lead to an overactive bladder.  Treatment for overactive bladder depends on the cause and severity of your condition.  Making lifestyle changes, doing Kegel exercises, keeping a log, and taking medicines can help with this condition. This information is not intended to replace advice given to you by your health care provider. Make sure you discuss any questions you have with your health care provider. Document Revised: 11/19/2019 Document Reviewed: 11/19/2019 Elsevier Patient Education  2021 [employer]  

## 2020-11-28 ENCOUNTER — Other Ambulatory Visit: Payer: Self-pay | Admitting: Obstetrics and Gynecology

## 2020-11-28 DIAGNOSIS — Z1231 Encounter for screening mammogram for malignant neoplasm of breast: Secondary | ICD-10-CM

## 2021-01-02 ENCOUNTER — Other Ambulatory Visit: Payer: Self-pay

## 2021-01-02 ENCOUNTER — Ambulatory Visit
Admission: RE | Admit: 2021-01-02 | Discharge: 2021-01-02 | Disposition: A | Discharge status: Home / Self Care | Payer: 59 | Source: Ambulatory Visit | Attending: Obstetrics and Gynecology | Admitting: Obstetrics and Gynecology

## 2021-01-02 DIAGNOSIS — Z1231 Encounter for screening mammogram for malignant neoplasm of breast: Secondary | ICD-10-CM

## 2021-02-17 ENCOUNTER — Other Ambulatory Visit: Payer: Self-pay | Admitting: Obstetrics and Gynecology

## 2021-02-17 NOTE — Telephone Encounter (Signed)
Last AEX on 03/10/20. Next scheduled for 03/30/21.

## 2021-03-15 DIAGNOSIS — C439 Malignant melanoma of skin, unspecified: Secondary | ICD-10-CM

## 2021-03-15 HISTORY — DX: Malignant melanoma of skin, unspecified: C43.9

## 2021-03-30 ENCOUNTER — Ambulatory Visit: Payer: 59 | Admitting: Obstetrics and Gynecology

## 2021-05-22 ENCOUNTER — Emergency Department (HOSPITAL_BASED_OUTPATIENT_CLINIC_OR_DEPARTMENT_OTHER): Payer: BC Managed Care – PPO | Admitting: Radiology

## 2021-05-22 ENCOUNTER — Other Ambulatory Visit: Payer: Self-pay

## 2021-05-22 ENCOUNTER — Emergency Department (HOSPITAL_BASED_OUTPATIENT_CLINIC_OR_DEPARTMENT_OTHER): Payer: BC Managed Care – PPO

## 2021-05-22 ENCOUNTER — Emergency Department (HOSPITAL_BASED_OUTPATIENT_CLINIC_OR_DEPARTMENT_OTHER)
Admission: EM | Admit: 2021-05-22 | Discharge: 2021-05-22 | Disposition: A | Discharge status: Home / Self Care | Payer: BC Managed Care – PPO | Attending: Emergency Medicine | Admitting: Emergency Medicine

## 2021-05-22 DIAGNOSIS — R079 Chest pain, unspecified: Secondary | ICD-10-CM

## 2021-05-22 DIAGNOSIS — J90 Pleural effusion, not elsewhere classified: Secondary | ICD-10-CM | POA: Diagnosis not present

## 2021-05-22 DIAGNOSIS — Z85820 Personal history of malignant melanoma of skin: Secondary | ICD-10-CM | POA: Diagnosis not present

## 2021-05-22 DIAGNOSIS — Z96642 Presence of left artificial hip joint: Secondary | ICD-10-CM | POA: Diagnosis not present

## 2021-05-22 DIAGNOSIS — R0789 Other chest pain: Secondary | ICD-10-CM | POA: Diagnosis present

## 2021-05-22 LAB — CBC
HCT: 38.7 % (ref 36.0–46.0)
Hemoglobin: 12.5 g/dL (ref 12.0–15.0)
MCH: 26.9 pg (ref 26.0–34.0)
MCHC: 32.3 g/dL (ref 30.0–36.0)
MCV: 83.4 fL (ref 80.0–100.0)
Platelets: 300 10*3/uL (ref 150–400)
RBC: 4.64 MIL/uL (ref 3.87–5.11)
RDW: 13.8 % (ref 11.5–15.5)
WBC: 6.6 10*3/uL (ref 4.0–10.5)
nRBC: 0 % (ref 0.0–0.2)

## 2021-05-22 LAB — BASIC METABOLIC PANEL
Anion gap: 9 (ref 5–15)
BUN: 9 mg/dL (ref 6–20)
CO2: 27 mmol/L (ref 22–32)
Calcium: 9.1 mg/dL (ref 8.9–10.3)
Chloride: 104 mmol/L (ref 98–111)
Creatinine, Ser: 0.67 mg/dL (ref 0.44–1.00)
GFR, Estimated: >60 mL/min (ref >60)
Glucose, Bld: 124 mg/dL — ABNORMAL HIGH (ref 70–99)
Potassium: 3.6 mmol/L (ref 3.5–5.1)
Sodium: 140 mmol/L (ref 135–145)

## 2021-05-22 LAB — D-DIMER, QUANTITATIVE: D-Dimer, Quant: 0.8 ug/mL-FEU — ABNORMAL HIGH (ref 0.00–0.50)

## 2021-05-22 LAB — TROPONIN I (HIGH SENSITIVITY)
Troponin I (High Sensitivity): <2 ng/L (ref <18)
Troponin I (High Sensitivity): <2 ng/L (ref <18)

## 2021-05-22 MED ORDER — IOHEXOL 350 MG/ML SOLN
100.0000 mL | Freq: Once | INTRAVENOUS | Status: AC | PRN
Start: 2021-05-22 — End: 2021-05-22
  Administered 2021-05-22: 60 mL via INTRAVENOUS

## 2021-05-22 MED ORDER — KETOROLAC TROMETHAMINE 30 MG/ML IJ SOLN
30.0000 mg | Freq: Once | INTRAMUSCULAR | Status: AC
  Administered 2021-05-22: 30 mg via INTRAVENOUS
  Filled 2021-05-22: qty 1

## 2021-05-22 MED ORDER — CYCLOBENZAPRINE HCL 10 MG PO TABS
10.0000 mg | ORAL_TABLET | Freq: Once | ORAL | Status: AC
  Administered 2021-05-22: 10 mg via ORAL
  Filled 2021-05-22: qty 1

## 2021-05-22 NOTE — ED Notes (Signed)
Pt returned to observation area after CTA with IV, no complications, secretary to inform charge nurse due to all nurses being busy. ? ?

## 2021-05-22 NOTE — ED Notes (Signed)
Pending AVS for discharge ?

## 2021-05-22 NOTE — ED Provider Notes (Signed)
[facility]   CSN: [id number] Arrival date & time: 05/22/21  1521     History  Chief Complaint  Patient presents with   Chest Pain    Miranda Morales is a 41 y.o. female.  HPI     Last night sat in chair, was stabbing pain to left side of chest This AM had the pain, went to work Left arm felt weird, like pain was wanting to go into the arm No radiation to back No shortness of breath,just pain with deep breaths No nausea, vomiting, abdominal pain Not sure if felt something in leg this morning or if imagination, left side Not dizzy or lightheaded Since being here it seems like it is worse if moving arm, breathing it will hurt in that spot No fever, cough, no history of pain like this  No htn,hlpd, DM Grandpa had some heart disease, Grandma at older age No smoking, other drugs  Had melanoma and another skin spot procedures, was in first layer of skin  Hip replacement 2021  No fam hx of blood clots, dissection  NO hospitalizatioin or surgery in past 6 weeks (other than outpt as above) Past Medical History:  Diagnosis Date   Abnormal EKG    Anxiety    Arthritis    Coccyx pain    --?broken during childbirth   DDD (degenerative disc disease), lumbar    Depression    Fatty liver 2020   Gallstones 2020   H/O cervical polypectomy    Seasonal allergies    Urinary incontinence     Past Surgical History:  Procedure Laterality Date   arthroscopic knee Right 2003   REPLACEMENT TOTAL HIP W/  RESURFACING IMPLANTS Left 01/28/2020   Dr. Darnell LevelSlade Moore    Home Medications Prior to Admission medications   Medication Sig Start Date End Date Taking? Authorizing Provider  celecoxib (CELEBREX) 200 MG capsule Take 200 mg by mouth daily. 09/07/19   [provider]  cetirizine (ZYRTEC) 10 MG tablet Take 10 mg by mouth daily.    [provider]  FLUoxetine (PROZAC) 40 MG capsule Take 1 tablet by mouth daily. 02/11/20    [provider]  HYDROCORTISONE ACE, RECTAL, 30 MG SUPP Place 1 suppository rectally as needed. 11/29/19   [provider]  methocarbamol (ROBAXIN) 500 MG tablet Take by mouth. 02/15/20   [provider]  Multiple Vitamin (MULTIVITAMIN) capsule Take 1 capsule by mouth daily.    [provider]  norethindrone (MICRONOR) 0.35 MG tablet TAKE 1 TABLET BY MOUTH EVERY DAY 02/17/21   Patton SallesAmundson C Silva, Brook E, MD      Allergies    Wound dressing adhesive    Review of Systems   Review of Systems  Physical Exam Updated Vital Signs BP 108/63    Pulse 65    Temp 99 F (37.2 C) (Oral)    Resp 15    Ht 5\' 8"  (1.727 m)    Wt 108.9 kg    SpO2 96%    BMI 36.49 kg/m  Physical Exam Vitals and nursing note reviewed.  Constitutional:      General: She is not in acute distress.    Appearance: She is well-developed. She is not diaphoretic.  HENT:     Head: Normocephalic and atraumatic.  Eyes:     Conjunctiva/sclera: Conjunctivae normal.  Cardiovascular:     Rate and Rhythm: Normal rate and regular rhythm.     Heart sounds: Normal heart sounds. No  murmur heard.   No friction rub. No gallop.  Pulmonary:     Effort: Pulmonary effort is normal. No respiratory distress.     Breath sounds: Normal breath sounds. No wheezing or rales.  Abdominal:     General: There is no distension.     Palpations: Abdomen is soft.     Tenderness: There is no abdominal tenderness. There is no guarding.  Musculoskeletal:        General: No tenderness.     Cervical back: Normal range of motion.  Skin:    General: Skin is warm and dry.     Findings: No erythema or rash.  Neurological:     Mental Status: She is alert and oriented to person, place, and time.    ED Results / Procedures / Treatments   Labs (all labs ordered are listed, but only abnormal results are displayed) Labs Reviewed  BASIC METABOLIC PANEL - Abnormal; Notable for the following components:      Result Value    Glucose, Bld 124 (*)    All other components within normal limits  D-DIMER, QUANTITATIVE - Abnormal; Notable for the following components:   D-Dimer, Quant 0.80 (*)    All other components within normal limits  CBC  TROPONIN I (HIGH SENSITIVITY)  TROPONIN I (HIGH SENSITIVITY)    EKG EKG Interpretation  Date/Time:  Friday May 22 2021 17:39:15 EST Ventricular Rate:  72 PR Interval:  158 QRS Duration: 117 QT Interval:  410 QTC Calculation: 449 R Axis:   67 Text Interpretation: Sinus rhythm Incomplete right bundle branch block Low voltage, precordial leads Confirmed by Alvira Monday ([id number]) on 05/22/2021 7:00:10 PM Also confirmed by Alvira Monday ([id number]), editor Harmon Pier, Ashwini ([id number])  on 05/23/2021 10:49:18 AM  Radiology CT Angio Chest PE W and/or Wo Contrast  Result Date: 05/22/2021 CLINICAL DATA:  Chest pain.  Elevated D-dimer. EXAM: CT ANGIOGRAPHY CHEST WITH CONTRAST TECHNIQUE: Multidetector CT imaging of the chest was performed using the standard protocol during bolus administration of intravenous contrast. Multiplanar CT image reconstructions and MIPs were obtained to evaluate the vascular anatomy. RADIATION DOSE REDUCTION: This exam was performed according to the departmental dose-optimization program which includes automated exposure control, adjustment of the mA and/or kV according to patient size and/or use of iterative reconstruction technique. CONTRAST:  18mL OMNIPAQUE IOHEXOL 350 MG/ML SOLN COMPARISON:  03/05/2005 FINDINGS: Cardiovascular: Satisfactory opacification of the pulmonary arteries to the segmental level. No evidence of pulmonary embolism. Normal heart size. No pericardial effusion. Mediastinum/Nodes: No enlarged mediastinal, hilar, or axillary lymph nodes. Thyroid gland, trachea, and esophagus demonstrate no significant findings. Lungs/Pleura: No focal consolidation or pneumothorax. Trace left pleural effusion. Upper Abdomen: No acute abnormality.  Musculoskeletal: No chest wall abnormality. No acute or significant osseous findings. Review of the MIP images confirms the above findings. IMPRESSION: 1. No pulmonary embolus. 2. Trace left pleural effusion. Electronically Signed   By: Elige Ko M.D.   On: 05/22/2021 17:06   US Venous Img Lower  Left (DVT Study)  Result Date: 05/22/2021 CLINICAL DATA:  Elevated D-dimer.  Concern for DVT.  Left leg pain. EXAM: Left LOWER EXTREMITY VENOUS DOPPLER ULTRASOUND TECHNIQUE: Gray-scale sonography with compression, as well as color and duplex ultrasound, were performed to evaluate the deep venous system(s) from the level of the common femoral vein through the popliteal and proximal calf veins. COMPARISON:  None. FINDINGS: VENOUS Normal compressibility of the common femoral, superficial femoral, and popliteal veins, as well as the visualized calf veins. Visualized  portions of profunda femoral vein and great saphenous vein unremarkable. No filling defects to suggest DVT on grayscale or color Doppler imaging. Doppler waveforms show normal direction of venous flow, normal respiratory plasticity and response to augmentation. Limited views of the contralateral common femoral vein are unremarkable. OTHER None. Limitations: none IMPRESSION: Negative. Electronically Signed   By: Elgie Collard M.D.   On: 05/22/2021 20:44    Procedures Procedures    Medications Ordered in ED Medications  iohexol (OMNIPAQUE) 350 MG/ML injection 100 mL (60 mLs Intravenous Contrast Given 05/22/21 1649)  ketorolac (TORADOL) 30 MG/ML injection 30 mg (30 mg Intravenous Given 05/22/21 1925)  cyclobenzaprine (FLEXERIL) tablet 10 mg (10 mg Oral Given 05/22/21 1924)    ED Course/ Medical Decision Making/ A&P                           Medical Decision Making Amount and/or Complexity of Data Reviewed Labs: ordered. Radiology: ordered.  Risk Prescription drug management.   41yo female with history of recent melanoma removed with skin  excision, left hip arthroplasty 01/2020, presents from PCP with concern for chest pain and ddimer positive.  Differential diagnosis for chest pain includes pulmonary embolus, dissection, pneumothorax, pneumonia, ACS, myocarditis, pericarditis.    EKG was done and evaluate by me and showed no acute ST changes and no signs of pericarditis.   Chest x-ray was done as outpatient.  DDimer elevated and CT PE study completed showing no PE, does show trace left sided pleural effusion.  Doubt dissection given equal bilateral blood pressures, equal pulses,in 4 extremities, no sign of this on CT, pain worse when going to sit up.  She is low risk HEART score and had delta troponins which were both negative.  Had sensations in left leg of discomfort and DVT study completed and negative.  Discussed it is possible discomfort is related to the trace pleural effusion or other and recommend follow up with PCP. Do not see indication for admission at this time.   GIven toradol and flexeril with some improvement. Recommend continuing with her home muscle relaxant, ibuprofen, tylenol.           Final Clinical Impression(s) / ED Diagnoses Final diagnoses:  Chest pain, unspecified type  Pleural effusion    Rx / DC Orders ED Discharge Orders     None         Alvira Monday, MD 05/23/21 1640

## 2021-05-22 NOTE — ED Notes (Addendum)
Pt reports new onset of chest pain of 8  out 10, she stated that she was a 3 before going to CT. Pt's vitals WDL with exception of resp at 24. MD notified 2nd EKG completed ?

## 2021-05-22 NOTE — ED Triage Notes (Signed)
Pt was sent over from PCP. Pt had chest pain started last night and went to PCP blood work showed elevated ddimer and was told to come to ER for CT. Pt states chest constant mild chest pain. Denies any SOB.  ?

## 2021-05-22 NOTE — ED Notes (Signed)
Pt stated that when she took deep breths her stabbing pain in her chest got worse. ?

## 2021-12-21 DIAGNOSIS — E559 Vitamin D deficiency, unspecified: Secondary | ICD-10-CM | POA: Insufficient documentation

## 2022-02-08 DIAGNOSIS — Z8742 Personal history of other diseases of the female genital tract: Secondary | ICD-10-CM | POA: Insufficient documentation

## 2022-02-08 DIAGNOSIS — R87619 Unspecified abnormal cytological findings in specimens from cervix uteri: Secondary | ICD-10-CM | POA: Insufficient documentation

## 2022-02-08 DIAGNOSIS — N8003 Adenomyosis of the uterus: Secondary | ICD-10-CM | POA: Insufficient documentation

## 2022-02-10 ENCOUNTER — Other Ambulatory Visit: Payer: Self-pay

## 2022-02-10 DIAGNOSIS — Z1231 Encounter for screening mammogram for malignant neoplasm of breast: Secondary | ICD-10-CM

## 2022-03-15 DIAGNOSIS — N8003 Adenomyosis of the uterus: Secondary | ICD-10-CM

## 2022-03-15 HISTORY — DX: Adenomyosis of the uterus: N80.03

## 2022-04-04 IMAGING — MG MM DIGITAL SCREENING BILAT W/ TOMO AND CAD
8 series · 8 of 24 positions shown · non-contrast
Comparison: Previous exam(s).

CLINICAL DATA: Screening.

EXAM:
DIGITAL SCREENING BILATERAL MAMMOGRAM WITH TOMOSYNTHESIS AND CAD
TECHNIQUE: Bilateral screening digital craniocaudal and mediolateral oblique
mammograms were obtained. Bilateral screening digital breast
tomosynthesis was performed. The images were evaluated with
computer-aided detection.

[R MLO synth-2D]
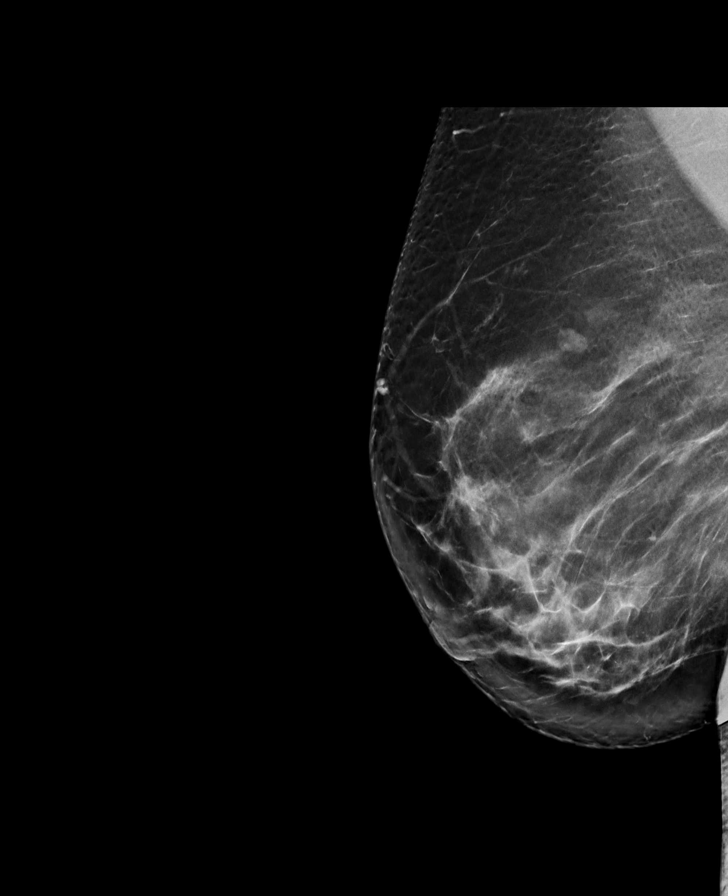

[R CC synth-2D]
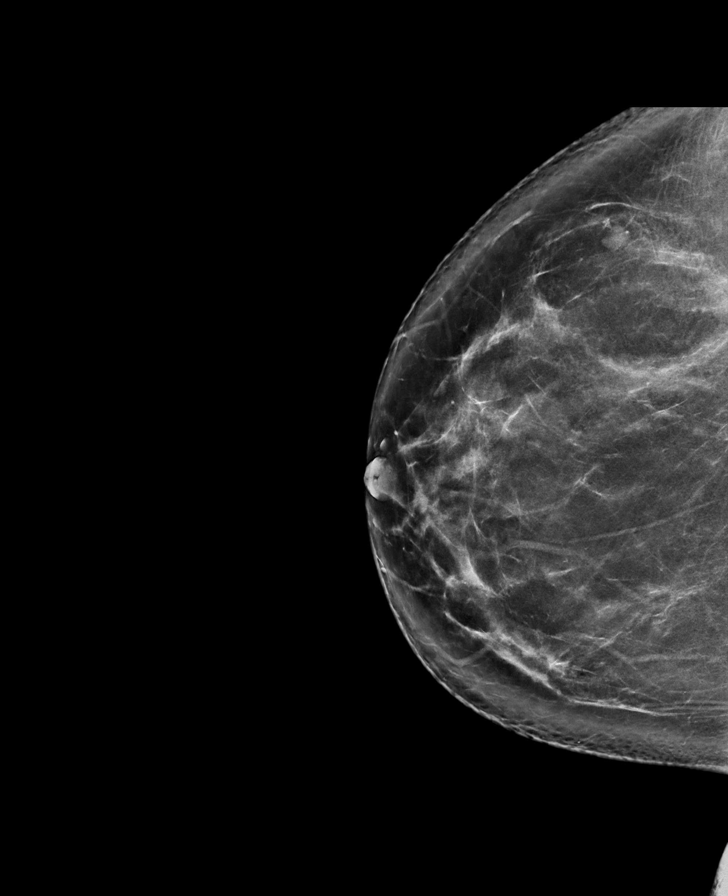

[L CC synth-2D]
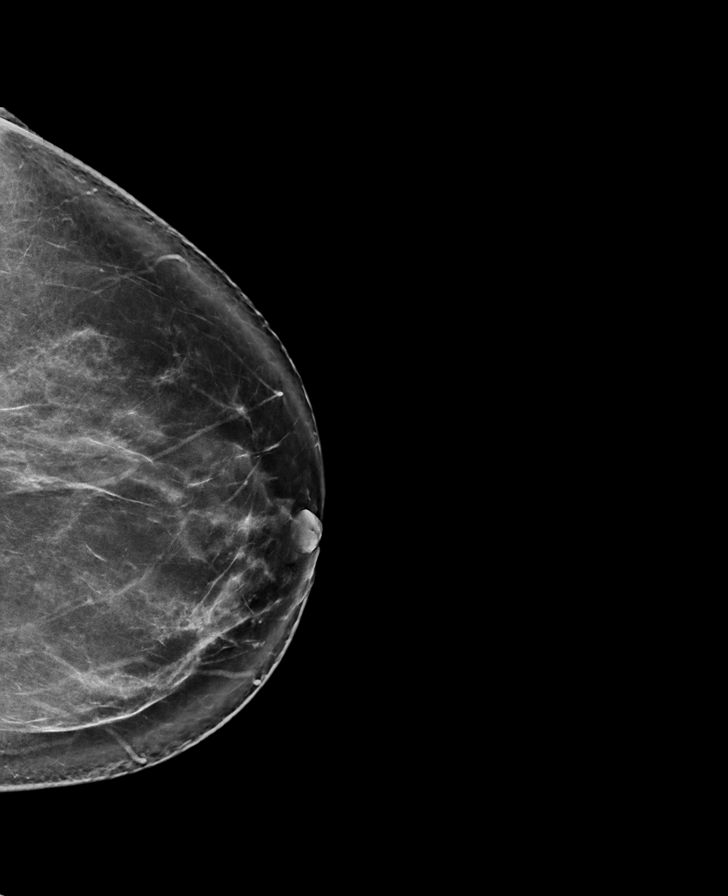

[L MLO synth-2D]
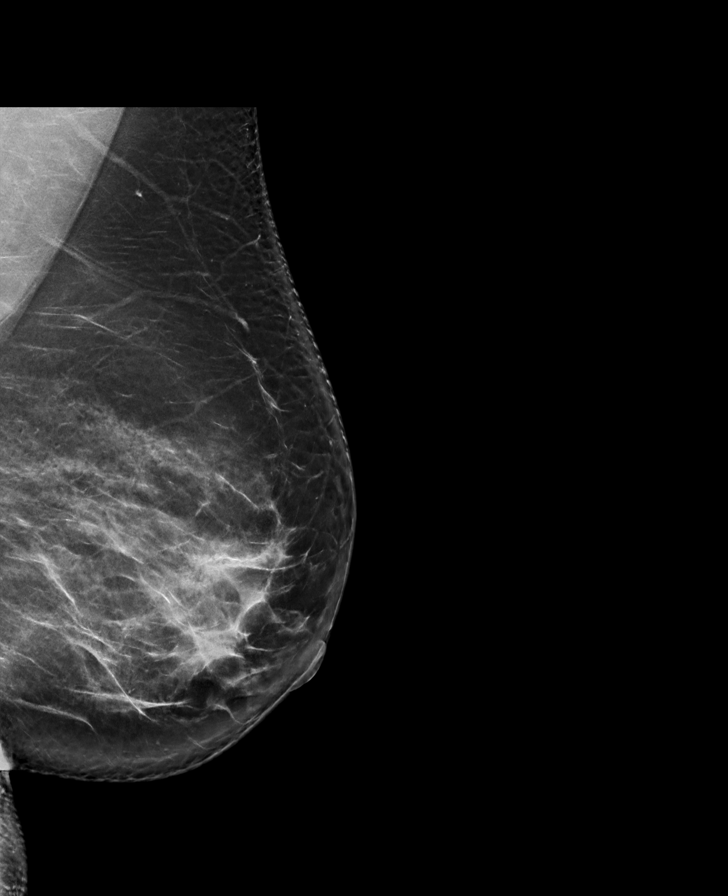

[L CC tomo · tomo slice 51/100.0]
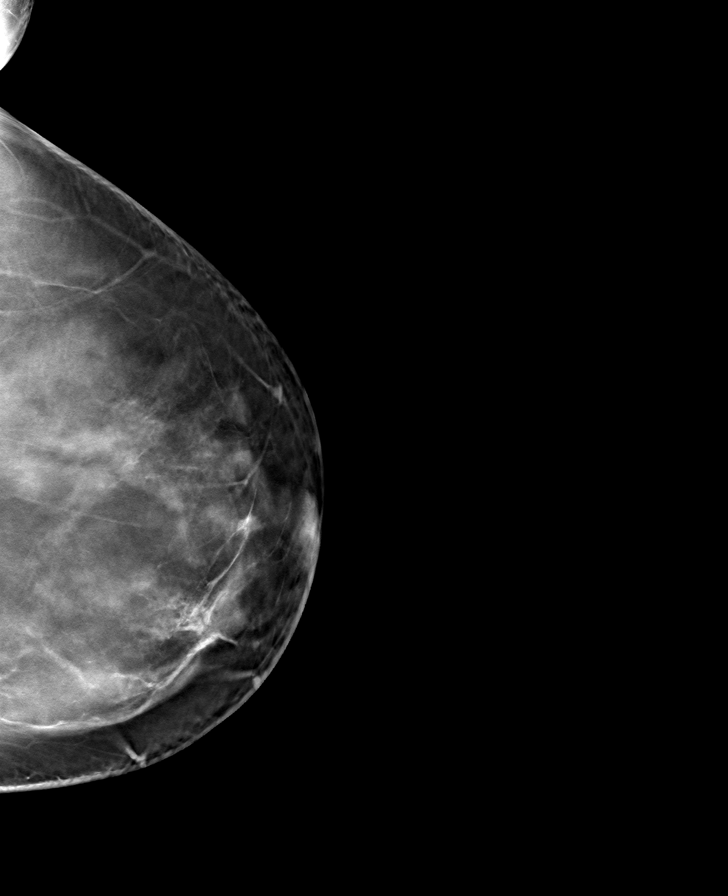

[R MLO tomo · tomo slice 53/106.0]
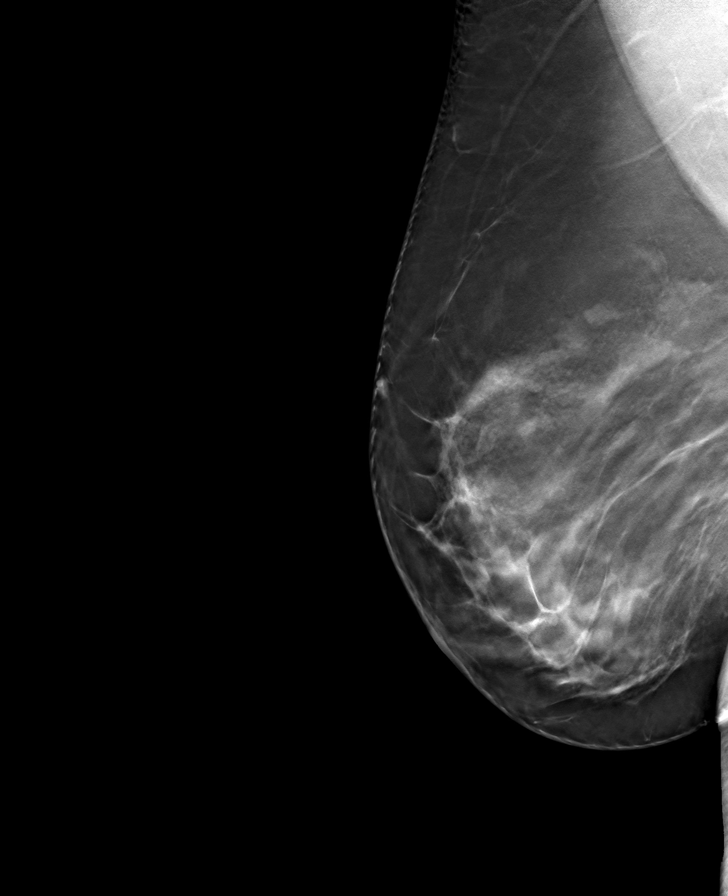

[L MLO tomo · tomo slice 51/101.0]
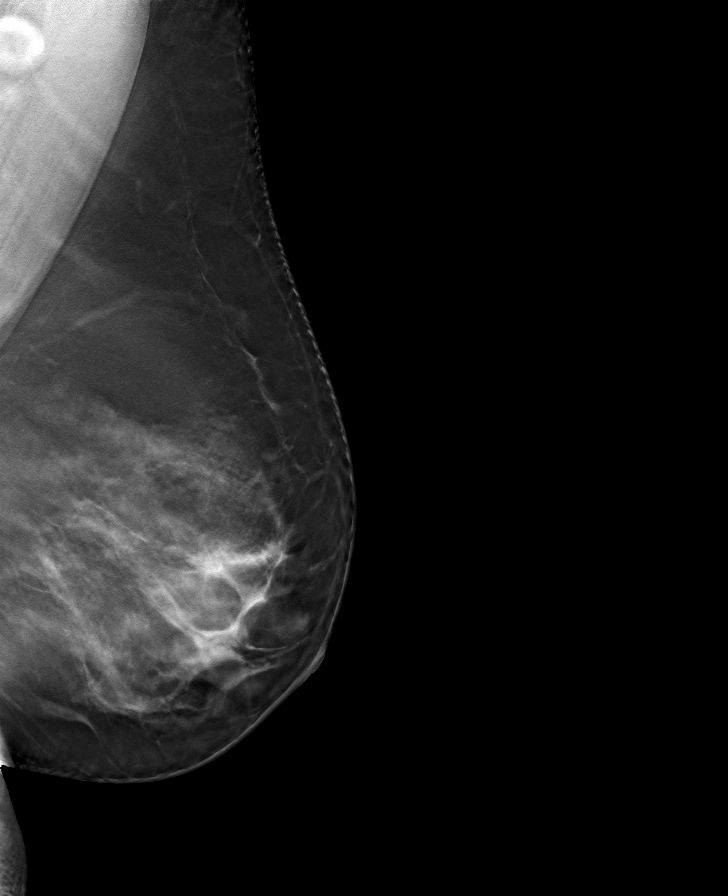

[R CC tomo · tomo slice 50/99.0]
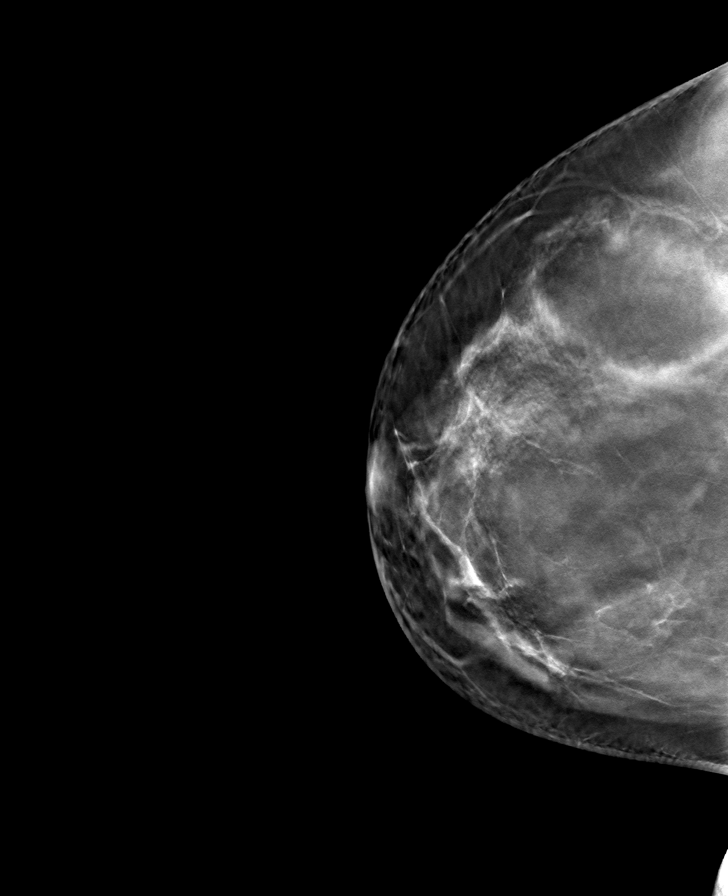

[8 of 24 positions shown; findings below may reference images not displayed]

ACR Breast Density Category c: The breast tissue is heterogeneously
dense, which may obscure small masses.
FINDINGS: There are no findings suspicious for malignancy.
IMPRESSION: No mammographic evidence of malignancy. A result letter of this
screening mammogram will be mailed directly to the patient.

RECOMMENDATION:
Screening mammogram in one year. (Code:Q3-W-BC3)

BI-RADS CATEGORY  1: Negative.

## 2022-04-07 ENCOUNTER — Ambulatory Visit
Admission: RE | Admit: 2022-04-07 | Discharge: 2022-04-07 | Disposition: A | Discharge status: Home / Self Care | Payer: BC Managed Care – PPO | Source: Ambulatory Visit | Attending: Family | Admitting: Family

## 2022-04-07 DIAGNOSIS — Z1231 Encounter for screening mammogram for malignant neoplasm of breast: Secondary | ICD-10-CM

## 2022-04-12 ENCOUNTER — Other Ambulatory Visit: Payer: Self-pay | Admitting: Family

## 2022-04-12 DIAGNOSIS — R928 Other abnormal and inconclusive findings on diagnostic imaging of breast: Secondary | ICD-10-CM

## 2022-04-13 ENCOUNTER — Other Ambulatory Visit: Payer: Self-pay | Admitting: Physician Assistant

## 2022-04-13 DIAGNOSIS — R928 Other abnormal and inconclusive findings on diagnostic imaging of breast: Secondary | ICD-10-CM

## 2022-04-16 ENCOUNTER — Ambulatory Visit
Admission: RE | Admit: 2022-04-16 | Discharge: 2022-04-16 | Disposition: A | Discharge status: Home / Self Care | Payer: BC Managed Care – PPO | Source: Ambulatory Visit | Attending: Family | Admitting: Family

## 2022-04-16 DIAGNOSIS — R928 Other abnormal and inconclusive findings on diagnostic imaging of breast: Secondary | ICD-10-CM

## 2022-07-13 DIAGNOSIS — M47816 Spondylosis without myelopathy or radiculopathy, lumbar region: Secondary | ICD-10-CM | POA: Insufficient documentation

## 2023-01-24 ENCOUNTER — Telehealth: Payer: Self-pay | Admitting: Physical Therapy

## 2023-01-24 ENCOUNTER — Ambulatory Visit: Payer: Medicaid Other | Attending: Obstetrics and Gynecology | Admitting: Physical Therapy

## 2023-01-24 NOTE — Telephone Encounter (Signed)
PT called pt about this morning's appointment at 0845. Pt did not answer, voicemail left.    Otelia Sergeant, PT, DPT 11/11/249:11 AM

## 2023-01-31 ENCOUNTER — Ambulatory Visit: Payer: Medicaid Other | Admitting: Physical Therapy

## 2023-02-07 ENCOUNTER — Other Ambulatory Visit (HOSPITAL_BASED_OUTPATIENT_CLINIC_OR_DEPARTMENT_OTHER): Payer: Self-pay

## 2023-02-07 ENCOUNTER — Encounter: Payer: BC Managed Care – PPO | Admitting: Physical Therapy

## 2023-02-07 MED ORDER — ZEPBOUND 2.5 MG/0.5ML ~~LOC~~ SOAJ
SUBCUTANEOUS | 0 refills | Status: DC
  Filled 2023-02-07: qty 2, 28d supply, fill #0

## 2023-02-08 ENCOUNTER — Other Ambulatory Visit (HOSPITAL_BASED_OUTPATIENT_CLINIC_OR_DEPARTMENT_OTHER): Payer: Self-pay

## 2023-02-14 ENCOUNTER — Encounter: Payer: BC Managed Care – PPO | Admitting: Physical Therapy

## 2023-03-15 ENCOUNTER — Other Ambulatory Visit (HOSPITAL_BASED_OUTPATIENT_CLINIC_OR_DEPARTMENT_OTHER): Payer: Self-pay

## 2023-03-15 MED ORDER — WEGOVY 0.25 MG/0.5ML ~~LOC~~ SOAJ
0.2500 mg | SUBCUTANEOUS | 0 refills | Status: AC
  Filled 2023-03-15: qty 2, 28d supply, fill #0

## 2023-04-16 DIAGNOSIS — G932 Benign intracranial hypertension: Secondary | ICD-10-CM

## 2023-04-16 HISTORY — DX: Benign intracranial hypertension: G93.2

## 2023-07-21 ENCOUNTER — Encounter: Payer: Self-pay | Admitting: Internal Medicine

## 2023-07-21 ENCOUNTER — Ambulatory Visit

## 2023-07-21 ENCOUNTER — Ambulatory Visit: Payer: Medicaid Other | Attending: Internal Medicine | Admitting: Internal Medicine

## 2023-07-21 VITALS — BP 115/79 | HR 72 | Resp 16 | Ht 67.5 in | Wt 228.8 lb

## 2023-07-21 DIAGNOSIS — M2550 Pain in unspecified joint: Secondary | ICD-10-CM | POA: Insufficient documentation

## 2023-07-21 DIAGNOSIS — R899 Unspecified abnormal finding in specimens from other organs, systems and tissues: Secondary | ICD-10-CM | POA: Diagnosis present

## 2023-07-21 DIAGNOSIS — M79642 Pain in left hand: Secondary | ICD-10-CM

## 2023-07-21 DIAGNOSIS — M79641 Pain in right hand: Secondary | ICD-10-CM | POA: Diagnosis not present

## 2023-07-21 NOTE — Progress Notes (Signed)
 Office Visit Note  Patient: Miranda Morales             Date of Birth: 1980/03/26           MRN: [id number]             PCP: Jearlean Mince, PA-C Referring: Jewell Mose, NP Visit Date: 07/21/2023 Occupation: Retail, previous [facility] [employer]  Subjective:  Pain of the Left Wrist and New Patient (Initial Visit) (Patient states she's here to insure that her and her Doctor aren't missing anything joint related. Patients states she had joint problems since a early age and already had a hip replacement and DDD)   Discussed the use of AI scribe software for clinical note transcription with the patient, who gave verbal consent to proceed.  History of Present Illness   Miranda Morales is a 43 year old female who presents with chronic joint pain and inflammation. She was referred for evaluation of her chronic joint problems and to ensure nothing is being missed regarding her joint health.  She has a history of hip arthritis that began after the birth of her second son, leading to a hip replacement after eight years of therapy and various treatments. Despite the surgery, she continues to experience pain, particularly in her back and knees, with a worsening of joint issues over time. Her father also had similar joint issues, suggesting a possible genetic component.  She has a history of knee surgery at the age of 18, with no clear cause identified for the joint issues. She reports a lack of cartilage in the last two vertebrae of her back and has experienced joint problems in multiple areas, including her wrists, elbows, shoulders, and neck. She experiences crepitus in her joints, including her neck and back, and has had a nerve block for her back pain, which was traumatic and unhelpful.  She has been in physical therapy for wrist pain, which began after working with a trainer and lifting weights. An X-ray revealed a previously undiagnosed fracture. She also reports pain in her arm after exertion, such as  pushing down on boards while working on a crawl space door.  She does not take any regular medication for her joint pain, managing her symptoms through activity modification. Her back pain is particularly severe after activities like bathing her dogs, described as 'jarring' when walking around the neighborhood. She has experienced swelling in her knees in the past, typically when they are aggravated.  Her family history includes her father having arthritis and undergoing hip replacement due to avascular necrosis. No chronic infections or frequent rashes. She is currently trying to increase her physical activity by walking on a treadmill, which she finds less jarring than walking outside.      Labs reviewed 10/2022 ANA neg ACE 30 Lyme neg Vit D 74.2  08/13/22 ESR wnl CRP 18  07/17/22 ESR 40 CRP 28  Imaging reviewed 02/03/23 Xray left elbow IMPRESSION:  No visualized abnormality.   06/30/22 Xray left wrist IMPRESSION:  No acute abnormality.   03/18/21 MRI right foot IMPRESSION:  2nd plantar flexor tenosynovitis and mild tendinosis.  Subcutaneous edema plantar aspect of the foot subcutaneous soft tissues which may be reactive related to the preceding or adventitial bursitis.  No visualized neuroma.  Hallux valgus and bunion deformity.  1st MTP joint degenerative changes.   02/15/20 Xray pelvis and left hip AP pelvis and left hip x-rays were performed and reviewed in the office  today shows good alignment and fixation of  the left hip without any  complications.    Activities of Daily Living:  Patient reports morning stiffness for 5-10 minutes.   Patient Denies nocturnal pain.  Difficulty dressing/grooming: Denies Difficulty climbing stairs: Reports Difficulty getting out of chair: Denies Difficulty using hands for taps, buttons, cutlery, and/or writing: Reports  Review of Systems  Constitutional:  Positive for fatigue.  HENT:  Negative for mouth sores and mouth dryness.    Eyes:  Negative for dryness.  Respiratory:  Negative for shortness of breath.   Cardiovascular:  Negative for chest pain and palpitations.  Gastrointestinal:  Positive for blood in stool, constipation and diarrhea.  Endocrine: Negative for increased urination.  Genitourinary:  Negative for involuntary urination.  Musculoskeletal:  Positive for joint pain, joint pain, joint swelling, myalgias, muscle weakness, morning stiffness, muscle tenderness and myalgias. Negative for gait problem.  Skin:  Negative for color change, rash, hair loss and sensitivity to sunlight.  Allergic/Immunologic: Negative for susceptible to infections.  Neurological:  Positive for dizziness and headaches.  Hematological:  Negative for swollen glands.  Psychiatric/Behavioral:  Positive for depressed mood and sleep disturbance. The patient is not nervous/anxious.     PMFS History:  Patient Active Problem List   Diagnosis Date Noted  . Polyarthralgia 07/21/2023  . Abnormal laboratory test result 07/21/2023  . Arthropathy of lumbar facet joint 07/13/2022  . Adenomyosis 02/08/2022  . Atypical glandular cells of undetermined significance (AGUS) on cervical Pap smear 02/08/2022  . History of ovarian cyst 02/08/2022  . Vitamin D deficiency 12/21/2021  . S/P total left hip arthroplasty 01/29/2020  . Restless leg 06/09/2017  . 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 Abnormal EKG   . Adenomyosis 2024  . Anxiety   . Arthritis   . Coccyx pain    --?broken during childbirth  . DDD (degenerative disc disease), lumbar   . Depression   . Fatty liver 2020  . Gallstones 2020  . H/O cervical polypectomy   . IIH (idiopathic intracranial hypertension) 04/2023  . Melanoma (HCC) 2023  . Seasonal allergies   . Urinary incontinence     Family History  Problem Relation Age of Onset  . Hypertension Mother   . Thyroid disease Mother        hypothyroidism  .  Hypertension Father   . Cancer Father 52       lung cancer  . Breast cancer Maternal Grandmother 77       dec from small bowel obstruction  . Hypertension Paternal Grandfather    Past Surgical History:  Procedure Laterality Date  . arthroscopic knee Left 2003  . CHOLECYSTECTOMY  2020  . 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 Darden Eaves   Social History   Social History Narrative  . Not on file    There is no immunization history on file for this patient.   Objective: Vital Signs: BP 115/79 (BP Location: Right Arm, Patient Position: Sitting, Cuff Size: Normal)   Pulse 72   Resp 16   Ht 5' 7.5" (1.715 m)   Wt 228 lb 12.8 oz (103.8 kg)   LMP 06/26/2023   BMI 35.31 kg/m    Physical Exam Constitutional:      Appearance: She is obese.  HENT:     Mouth/Throat:     Mouth: Mucous membranes are moist.     Pharynx: Oropharynx is clear.  Eyes:     Conjunctiva/sclera:  Conjunctivae normal.  Cardiovascular:     Rate and Rhythm: Normal rate and regular rhythm.  Pulmonary:     Effort: Pulmonary effort is normal.     Breath sounds: Normal breath sounds.  Musculoskeletal:     Right lower leg: No edema.     Left lower leg: No edema.  Lymphadenopathy:     Cervical: No cervical adenopathy.  Skin:    General: Skin is warm and dry.     Findings: No rash.  Neurological:     Mental Status: She is alert.  Psychiatric:        Mood and Affect: Mood normal.     Musculoskeletal Exam:  Shoulders full ROM no tenderness or swelling Elbows full ROM no tenderness or swelling Wrists full ROM no tenderness or swelling Fingers full ROM no tenderness or swelling No paraspinal tenderness to palpation over upper and lower back Knees full ROM, no tenderness or palpable effusion, patellofemoral crepitus Ankles full ROM, achilles insertion bony nodule b/l 1st MTP bunions b/l with moderate lateral deviations   Investigation: No additional  findings.  Imaging: XR Hand 2 View Right Result Date: 07/29/2023 X-ray right hand 2 views Radiocarpal and carpal joint appear normal.  MCP PIP and DIP joint spaces appear normal.  No erosions or abnormal calcifications seen.  Bone mineralization appears normal. Impression Normal hand x-ray  XR Hand 2 View Left Result Date: 07/29/2023 X-ray left hand 2 views Radiocarpal and carpal joint appear normal.  MCP PIP and DIP joint spaces appear normal.  No erosions or abnormal calcifications seen.  Bone mineralization appears normal. Impression Normal hand x-ray   Recent Labs: Lab Results  Component Value Date   WBC 6.6 05/22/2021   HGB 12.5 05/22/2021   PLT 300 05/22/2021   NA 140 05/22/2021   K 3.6 05/22/2021   CL 104 05/22/2021   CO2 27 05/22/2021   GLUCOSE 124 (H) 05/22/2021   BUN 9 05/22/2021   CREATININE 0.67 05/22/2021   BILITOT 0.3 08/01/2015   ALKPHOS 59 08/01/2015   AST 16 08/01/2015   ALT 18 08/01/2015   PROT 6.6 08/01/2015   ALBUMIN 4.4 08/01/2015   CALCIUM 9.1 05/22/2021   GFRAA >60 03/19/2016    Speciality Comments: No specialty comments available.  Procedures:  No procedures performed Allergies: Wound dressing adhesive   Assessment / Plan:     Visit Diagnoses: Polyarthralgia - Plan: XR Hand 2 View Right, XR Hand 2 View Left, Sedimentation rate, Rheumatoid factor, Cyclic citrul peptide antibody, IgG, Anti-DNAse B antibody, IgG, IgA, IgM Chronic joint pain with advanced osteoarthritis in some areas reltive to age and without specific trauma history. Cartilage thinning and bone spurring noted. Differential includes rheumatoid arthritis and other inflammatory conditions. Previous abnormal inflammation markers. Family history of arthritis. Xrays of hands in clinic today look normal though, without erosions or joint space loss. - Order sedimentation rate and C-reactive protein. - Checking RF and CCP for possible RA although no peripheral synovitis today - Xrays b/l hands  checked were normal - Consider referral to sports medicine or physiatry if symptoms persist.  Melanoma Recent biopsy confirmed melanoma. Advised strict sun protection measures. - Use sunscreen religiously. - Avoid sun exposure.        Orders: Orders Placed This Encounter  Procedures  . XR Hand 2 View Right  . XR Hand 2 View Left  . Sedimentation rate  . Rheumatoid factor  . Cyclic citrul peptide antibody, IgG  . Anti-DNAse B antibody  . IgG,  IgA, IgM   No orders of the defined types were placed in this encounter.    Follow-Up Instructions: No follow-ups on file.   Matt Song, MD  Note - This record has been created using [employer].  Chart creation errors have been sought, but may not always  have been located. Such creation errors do not reflect on  the standard of medical care.

## 2023-07-26 LAB — IGG, IGA, IGM
IgG (Immunoglobin G), Serum: 818 mg/dL (ref 600–1640)
IgM, Serum: 45 mg/dL — ABNORMAL LOW (ref 50–300)
Immunoglobulin A: 113 mg/dL (ref 47–310)

## 2023-07-26 LAB — ANTI-DNASE B ANTIBODY: Anti-DNAse-B: <95 U/mL (ref <301)

## 2023-07-26 LAB — RHEUMATOID FACTOR: Rheumatoid fact SerPl-aCnc: <10 [IU]/mL (ref <14)

## 2023-07-26 LAB — CYCLIC CITRUL PEPTIDE ANTIBODY, IGG: Cyclic Citrullin Peptide Ab: <16 U

## 2023-07-26 LAB — SEDIMENTATION RATE: Sed Rate: 17 mm/h (ref 0–20)

## 2023-07-27 ENCOUNTER — Encounter: Payer: Self-pay | Admitting: Internal Medicine

## 2023-08-01 NOTE — Telephone Encounter (Signed)
 Patient called to check the results of her lab work. Patient advised that Dr. Rodell Citrin is out of office until next week. Please advise

## 2023-08-11 ENCOUNTER — Telehealth: Payer: Self-pay | Admitting: Internal Medicine

## 2023-08-11 ENCOUNTER — Ambulatory Visit: Payer: Self-pay | Admitting: Internal Medicine

## 2023-08-11 NOTE — Progress Notes (Signed)
 Sorry for the delayed response about lab results.  Overall her results are reassuring.  The rheumatoid factor and CCP antibody tests for rheumatoid arthritis were negative.  The sedimentation rate test for inflammation was normal.  The DNase test was negative.  Her immunoglobulin levels look okay did not indicate significant overall or underactivity of the immune system.  Her IgM was very slightly low at 45 (normal over 50) but I strongly doubt this is significant.  X-rays of both hands do not show any significant inflammatory or wear-and-tear damage.  This is good as she does have some early degenerative changes in the previous x-rays more in her lower extremities.  I do not think we need to follow-up about starting any treatment for inflammatory or autoimmune conditions.  I would recommend she may benefit from follow-up with ortho sports medicine about her ongoing back and hip problems.

## 2023-08-11 NOTE — Telephone Encounter (Signed)
 Patient left a message on the answering machine requesting to speak with the office manager. Pt stated she has been trying to get in contact with the Dr about her blood test for weeks now and still has not heard anything. Pt was very upset stating it has been 21 days and would like a call back at [phone]

## 2023-08-11 NOTE — Telephone Encounter (Signed)
 I called patient with results, patient verbalized understanding.

## 2023-10-27 ENCOUNTER — Other Ambulatory Visit (HOSPITAL_BASED_OUTPATIENT_CLINIC_OR_DEPARTMENT_OTHER): Payer: Self-pay

## 2023-10-27 MED ORDER — HEPLISAV-B 20 MCG/0.5ML IM SOSY
PREFILLED_SYRINGE | INTRAMUSCULAR | 0 refills | Status: AC
  Filled 2023-10-27: qty 0.5, 1d supply, fill #0

## 2023-10-28 ENCOUNTER — Other Ambulatory Visit (HOSPITAL_BASED_OUTPATIENT_CLINIC_OR_DEPARTMENT_OTHER): Payer: Self-pay

## 2023-10-28 MED ORDER — MEASLES, MUMPS & RUBELLA VAC IJ SOLR
0.5000 mL | Freq: Once | INTRAMUSCULAR | 0 refills | Status: AC
  Filled 2023-10-28: qty 0.5, 1d supply, fill #0

## 2023-10-31 ENCOUNTER — Other Ambulatory Visit (HOSPITAL_BASED_OUTPATIENT_CLINIC_OR_DEPARTMENT_OTHER): Payer: Self-pay

## 2023-11-07 ENCOUNTER — Other Ambulatory Visit (HOSPITAL_BASED_OUTPATIENT_CLINIC_OR_DEPARTMENT_OTHER): Payer: Self-pay

## 2023-11-30 NOTE — Progress Notes (Signed)
 Patient ID:  Miranda Morales is a 43 y.o. (DOB [birth year]) female.  Assessment and Plan   1. Pre-diabetes   2. Class 2 obesity due to excess calories with body mass index (BMI) of 37.0 to 37.9 in adult, unspecified whether serious comorbidity present     Assessment & Plan 1. Weight management: Stable. - Wegovy  1 mg for weight loss, approximately 39 pounds ltotal weight loss - No significant side effects, continues to lose 1 to 2 pounds per week. - Maintain daily protein intake of 90 to 100 mg. - Continue physical activities. - [employer] app suggested for exercise videos. - Prescription refill for Wegovy  1 mg to be sent to pharmacy.  Pre diab Blood sugars are improving since she has lost weight continue current healthy diet and exercise decrease carbs and sugars follow-up in 4 weeks sooner if needed Follow-up - Follow-up visit scheduled in 1 month.   Results    HM needs:  Patient verbalized to me that they understood what their problem is, what they need to do about it and why it is important that they do it. I have reviewed the information contained in this note and personally verified its accuracy.  I obtained the history of present illness and personally performed the physical exam Voice recognition software was used in creation of this note . Despite my best efforts at editing the text, some misspellings / errors  may have occurred.    Patient understands and agrees with plan. Computer technology was used to create visit note. Consent from the patient/caregiver was obtained prior to use.  Follow up in about 4 weeks (around 12/28/2023).    Patient's Medications       * Accurate as of November 30, 2023  3:55 PM. Reflects encounter med changes as of last refresh          Continued Medications      Instructions  acetaZOLAMIDE 250 mg tablet Commonly known as: DIAMOX  750 mg, Oral, 2 times a day   fluoxetine 40 MG capsule Commonly known as: PROZAC  40 mg, Oral,  Daily   levocetirizine 5 mg tablet Commonly known as: XYZAL  5 mg, Every evening   meclizine HCl 12.5 mg tablet Commonly known as: ANTIVERT  Oral, 3 times a day as needed, 0   Misc. Devices Misc  MMR vaccine Heplisav Hepatitis B vaccine   ondansetron  8 mg tablet Commonly known as: ZOFRAN   8 mg, Every 8 hours as needed   rizatriptan 10 MG tablet Commonly known as: MAXALT  1 tab by mouth at the onset of a headache.  May repeat in 2 hours if needed.  Do not exceed more than two doses in a 24 hour period.       Modified Medications      Instructions  semaglutide -weight management 1 mg/0.5 mL Soaj injection Commonly known as: WEGOVY  What changed: Another medication with the same name was removed. Continue taking this medication, and follow the directions you see here.  1 mg, Subcutaneous, Weekly       Discontinued Medications    famotidine 20 mg tablet Commonly known as: PEPCID        No orders of the defined types were placed in this encounter.   Risks, benefits, and alternatives of the medications and treatment plan prescribed today were discussed, and patient expressed understanding. Plan follow-up as discussed or as needed if any worsening symptoms or change in condition.    A yearly preventative health exam was  recommended and current age based recommendations were discussed.   Subjective   Patient ID:  Miranda Morales is a 43 y.o. (DOB January 30, 1981) female    Patient presents with  . Weight Check     HPI:    History of Present Illness The patient presents for a follow-up visit. Overall, she is doing well. She is currently on Wegovy  for weight loss.  She has been on Wegovy  1 mg for weight loss and has lost approximately 39 pounds total since starting Wegovy  with her current weight being 200.8 pounds. She reports no side effects from the medication and continues to lose 1 to 2 pounds per week. Her weight loss progress had slowed down slightly but has  since picked up again. She maintains an active lifestyle, walking at least 2.5 miles on weekends and performing various exercises during the week. She believes that her diet and exercise regimen, along with the Wegovy , have contributed to her weight loss. The medication has also helped curb her appetite and reduce food cravings. She occasionally indulges in dark chocolate but often finds herself unable to finish her meals. She tries to do arm exercises one day and walk for at least 30 minutes before work a couple of days.   Results for orders placed or performed in visit on 08/10/23  Hemoglobin A1c  Result Value Ref Range   Hemoglobin A1c 5.3 4.8 - 5.6 %   Narrative   Performed at:  [address], [place], [place]  [id number] Lab Director: Frankey Sas MD, Phone:  [phone]  Results for orders placed or performed in visit on 02/04/23  Hemoglobin A1c  Result Value Ref Range   Hemoglobin A1c 5.7 (H) 4.8 - 5.6 %   Narrative   Performed at:  [address], [place], [place]  [id number] Lab Director: Frankey Sas MD, Phone:  [phone]  Results for orders placed or performed in visit on 01/08/20  Hemoglobin A1c  Result Value Ref Range   Hemoglobin A1c 5.1 4.8 - 5.6 %   Narrative   Reference Interval:                  4.8 - 5.6% Increased Risk for Diabetes:         5.7 - 6.4% Diabetes:                             >=6.5% Glycemic Control for Adults with Diabetes:                        <7.0%        11/30/23 1538 : 204 lb 6.4 oz (92.7 kg) 11/18/23 1525 : 208 lb 3.2 oz (94.4 kg) 10/25/23 1305 : 212 lb 6.4 oz (96.3 kg) 10/25/23 1025 : 214 lb (97.1 kg) 10/18/23 1048 : 214 lb (97.1 kg) 09/13/23 0849 : 217 lb 6.4 oz (98.6 kg)    BMI Readings from Last 6 Encounters:  11/30/23 31.54 kg/m  11/18/23 32.13 kg/m  10/25/23 32.78 kg/m  10/25/23 33.02 kg/m  10/18/23 33.02 kg/m  09/13/23 33.55 kg/m    Diet: She occasionally indulges  in dark chocolate but often finds herself unable to finish her meals.    Past Medical History, Past Surgery History, Allergies, Social History, and Family History were reviewed and updated.    Review of Systems   Objective   Vitals:   11/30/23  1538  BP: 116/72  Patient Position: Sitting  Pulse: 77  Temp: 98.3 F (36.8 C)  TempSrc: Oral  Resp: 16  Height: 5' 7.5 (1.715 m)  Weight: 204 lb 6.4 oz (92.7 kg)  SpO2: 99%  BMI (Calculated): 31.5  PainSc: 0-No pain      Physical Exam     Voice recognition software was used and creation of this note. Despite my best effort at editing the text, some misspelling/errors may have occurred. *Some images could not be shown.

## 2023-12-20 NOTE — Progress Notes (Signed)
 Assessment and Plan   1. Diarrhea of presumed infectious origin (Primary) -     GI Profile, Stool, PCR Stool; Future -     azithromycin (ZITHROMAX) 500 mg tablet; Take one tablet (500 mg dose) by mouth daily for 5 days., Starting Tue 12/20/2023, Until Sun 12/25/2023, Normal 2. Class 2 obesity due to excess calories with body mass index (BMI) of 37.0 to 37.9 in adult, unspecified whether serious comorbidity present Comments: on Wegovy     Gi profile today Then start azithromycin No work until Thursday     Risks, benefits, and alternatives of the medications and treatment plan prescribed today were discussed, and patient expressed understanding. Plan follow-up as discussed or as needed if any worsening symptoms or change in condition.      Subjective   Miranda Morales is a 43 y.o. (DOB 05/11/1980) female who presents for the following:    Patient presents with  . Diarrhea    Pt presents today with diarrhea and chills x 24 hours. She is cramping, she had her gallbladder taken out 4 years ago.     Diarrhea  This is a new problem. The current episode started yesterday. The problem occurs more than 10 times per day. The stool consistency is described as Watery. The patient states that diarrhea awakens her from sleep. Associated symptoms include abdominal pain, bloating, chills and vomiting. Pertinent negatives include no arthralgias, coughing, fever, headaches, increased  flatus, myalgias, sweats, URI or weight loss. There are no known risk factors. She has tried anti-motility drug for the symptoms. The treatment provided no relief.     Reviewed and updated this visit by provider: Tobacco  Allergies  Meds  Problems  Med Hx  Surg Hx  Fam Hx      Review of Systems  Constitutional:  Positive for chills. Negative for fever and weight loss.  Respiratory:  Negative for cough.   Gastrointestinal:  Positive for abdominal pain, bloating, diarrhea and vomiting. Negative for flatus.   Musculoskeletal:  Negative for arthralgias and myalgias.  Neurological:  Negative for headaches.          Objective   Vitals:   12/20/23 1539  BP: 115/74  Patient Position: Sitting  Pulse: 85  Temp: 98 F (36.7 C)  TempSrc: Oral  Resp: 16  Height: 5' 0.75 (1.543 m)  Weight: 197 lb (89.4 kg)  SpO2: 97%  BMI (Calculated): 37.5    Physical Exam Vitals and nursing note reviewed.  Constitutional:      General: She is not in acute distress.    Appearance: Normal appearance. She is well-developed. She is not ill-appearing, toxic-appearing or diaphoretic.  HENT:     Head: Normocephalic and atraumatic.     Right Ear: External ear normal.     Left Ear: External ear normal.     Nose: Nose normal.  Eyes:     Pupils: Pupils are equal, round, and reactive to light.  Neck:     Thyroid: No thyromegaly.     Vascular: No carotid bruit.     Trachea: No tracheal deviation.  Cardiovascular:     Rate and Rhythm: Normal rate and regular rhythm.     Heart sounds: Normal heart sounds. No murmur heard.    No friction rub. No gallop.  Musculoskeletal:     Cervical back: Normal range of motion and neck supple.  Pulmonary:     Effort: Pulmonary effort is normal. No respiratory distress.     Breath sounds: Normal breath sounds.  No wheezing.  Abdominal:     General: Bowel sounds are normal. There is no distension.     Palpations: Abdomen is soft. There is no mass.     Tenderness: There is abdominal tenderness. There is no guarding or rebound.     Hernia: No hernia is present.  Lymphadenopathy:     Cervical: No cervical adenopathy.  Skin:    General: Skin is warm and dry.     Coloration: Skin is not pale.     Findings: No erythema or rash.  Neurological:     Mental Status: She is alert and oriented to person, place, and time.     Gait: Gait normal.     Deep Tendon Reflexes: Reflexes are normal and symmetric.  Psychiatric:        Mood and Affect: Mood normal.        Behavior: Behavior  normal.        Thought Content: Thought content normal.        Judgment: Judgment normal.

## 2024-01-11 NOTE — Progress Notes (Signed)
 [facility] Video Visit   Patient ID:  Cassadi Purdie is a 43 y.o. (DOB August 28, 1980) female Place of service: patient home Patient has been advised as to the limitations and limited nature of physical exam due to nature of a video visit, the possibility of privacy risk in the use of a video visit, and that the healthcare provider may recommend visiting a healthcare clinic for in-person care and follow up.  Video Visit Assessment and Plan   1. Dysuria (Primary) -     Culture, Urine - including low colony counts Urine 2. Acute cystitis with hematuria Other orders -     nitrofurantoin, macrocrystal-monohydrate, (MACROBID) 100 mg capsule; Take one capsule (100 mg dose) by mouth 2 (two) times daily for 7 days., Starting Wed 01/11/2024, Until Wed 01/18/2024, Normal    Recommend start Macrobid twice a day for 7 days increase fluids will follow-up when urine culture is back  Patient is also very frustrated since Medicaid is no longer covering the GLP-1 Patient is done very well with her weight loss Encouraged her to continue small frequent meals increase protein and regular exercise  Patient verbalized to me that they understood what their problem is, what they need to do about it and why it is important that they do it. I have reviewed the information contained in this note and personally verified its accuracy.  I obtained the history of present illness and personally performed the physical exam Voice recognition software was used in creation of this note . Despite my best efforts at editing the text, some misspellings / errors  may have occurred.     Outpatient Encounter Medications as of 01/11/2024:  .  acetaZOLAMIDE (DIAMOX) 250 mg tablet, Take three tablets (750 mg dose) by mouth 2 (two) times daily. .  fluoxetine (PROZAC) 40 MG capsule, Take one capsule (40 mg dose) by mouth daily. SABRA  levocetirizine (XYZAL) 5 MG tablet, Take one tablet (5 mg dose) by mouth every evening. .  meclizine  HCl (ANTIVERT) 12.5 mg tablet, TAKE 1 TO 2 TABLETS BY MOUTH 3 TIMES A DAY AS NEEDED .  Misc. Devices MISC, MMR vaccine Heplisav Hepatitis B vaccine .  nitrofurantoin, macrocrystal-monohydrate, (MACROBID) 100 mg capsule, Take one capsule (100 mg dose) by mouth 2 (two) times daily for 7 days. SABRA  omeprazole (PRILOSEC) 20 mg capsule, Take one capsule (20 mg dose) by mouth daily as needed (heartburn). .  ondansetron  (ZOFRAN ) 8 MG tablet, Take one tablet (8 mg dose) by mouth every 8 (eight) hours as needed. .  rizatriptan (MAXALT) 10 MG tablet, 1 tab by mouth at the onset of a headache.  May repeat in 2 hours if needed.  Do not exceed more than two doses in a 24 hour period. .  semaglutide -weight management (WEGOVY ) 1 mg/0.5 mL SOAJ injection, Inject 0.5 mLs (1 mg dose) into the skin once a week.   Risk, benefits, and alternatives were provided through patient instructions given to the patient electronically and during the video interaction.  If any worsening symptoms or lack of improvement, the patient will seek immediate medical care.   Video Visit History  No chief complaint on file.    HPI   Patient is here for video visit she is at home and I am in the office patient is had a few day history of urinary frequency and urgency and some pressure in her lower abdomen No nausea vomiting no low back pain no fever  She dropped off a urine  today but had taken Azo we will send the urine for culture since the urine dipstick is not accurate after taking Azo   Reviewed and updated this visit by provider: Tobacco  Allergies  Meds  Problems  Med Hx  Surg Hx  Fam Hx        Review of Systems      Video Visit Objective Findings   Examination conducted with the use of video cameras/computer monitors. Vital signs and other aspects of physical exam are limited due to the nature of this encounter.   Physical Exam Constitutional:      General: She is not in acute distress.    Appearance: Normal  appearance. She is normal weight. She is not ill-appearing, toxic-appearing or diaphoretic.  Neurological:     Mental Status: She is alert.  Psychiatric:        Mood and Affect: Mood normal.        Behavior: Behavior normal.        Thought Content: Thought content normal.        Judgment: Judgment normal.

## 2024-01-13 NOTE — Progress Notes (Signed)
 pressure pain frequency concern for interstitial cystitis Will referr to urogyn

## 2024-01-16 NOTE — Progress Notes (Signed)
 Margert, Your urine culture does confirm there is no UTI.  I agree you should see the urogynecologist let  me know if there are any issues getting scheduled. SCS

## 2024-01-29 ENCOUNTER — Emergency Department (HOSPITAL_COMMUNITY)

## 2024-01-29 ENCOUNTER — Other Ambulatory Visit: Payer: Self-pay

## 2024-01-29 ENCOUNTER — Emergency Department (HOSPITAL_COMMUNITY)
Admission: EM | Admit: 2024-01-29 | Discharge: 2024-01-29 | Disposition: A | Discharge status: Home / Self Care | Attending: Emergency Medicine | Admitting: Emergency Medicine

## 2024-01-29 DIAGNOSIS — N132 Hydronephrosis with renal and ureteral calculous obstruction: Secondary | ICD-10-CM | POA: Diagnosis not present

## 2024-01-29 DIAGNOSIS — R1032 Left lower quadrant pain: Secondary | ICD-10-CM | POA: Diagnosis present

## 2024-01-29 DIAGNOSIS — N200 Calculus of kidney: Secondary | ICD-10-CM

## 2024-01-29 LAB — URINALYSIS, ROUTINE W REFLEX MICROSCOPIC
Bilirubin Urine: NEGATIVE
Glucose, UA: NEGATIVE mg/dL
Hgb urine dipstick: NEGATIVE
Ketones, ur: 5 mg/dL — AB
Leukocytes,Ua: NEGATIVE
Nitrite: NEGATIVE
Protein, ur: NEGATIVE mg/dL
Specific Gravity, Urine: 1.033 — ABNORMAL HIGH (ref 1.005–1.030)
pH: 5 (ref 5.0–8.0)

## 2024-01-29 LAB — LIPASE, BLOOD: Lipase: 31 U/L (ref 11–51)

## 2024-01-29 LAB — COMPREHENSIVE METABOLIC PANEL WITH GFR
ALT: 24 U/L (ref 0–44)
AST: 24 U/L (ref 15–41)
Albumin: 4.2 g/dL (ref 3.5–5.0)
Alkaline Phosphatase: 91 U/L (ref 38–126)
Anion gap: 9 (ref 5–15)
BUN: 13 mg/dL (ref 6–20)
CO2: 24 mmol/L (ref 22–32)
Calcium: 9.1 mg/dL (ref 8.9–10.3)
Chloride: 108 mmol/L (ref 98–111)
Creatinine, Ser: 0.75 mg/dL (ref 0.44–1.00)
GFR, Estimated: >60 mL/min (ref >60)
Glucose, Bld: 84 mg/dL (ref 70–99)
Potassium: 3.8 mmol/L (ref 3.5–5.1)
Sodium: 140 mmol/L (ref 135–145)
Total Bilirubin: 0.3 mg/dL (ref 0.0–1.2)
Total Protein: 6.8 g/dL (ref 6.5–8.1)

## 2024-01-29 LAB — CBC WITH DIFFERENTIAL/PLATELET
Abs Immature Granulocytes: 0.03 K/uL (ref 0.00–0.07)
Basophils Absolute: 0.1 K/uL (ref 0.0–0.1)
Basophils Relative: 1 %
Eosinophils Absolute: 0.2 K/uL (ref 0.0–0.5)
Eosinophils Relative: 3 %
HCT: 39.4 % (ref 36.0–46.0)
Hemoglobin: 12.6 g/dL (ref 12.0–15.0)
Immature Granulocytes: 0 %
Lymphocytes Relative: 27 %
Lymphs Abs: 1.9 K/uL (ref 0.7–4.0)
MCH: 28.8 pg (ref 26.0–34.0)
MCHC: 32 g/dL (ref 30.0–36.0)
MCV: 90 fL (ref 80.0–100.0)
Monocytes Absolute: 0.4 K/uL (ref 0.1–1.0)
Monocytes Relative: 6 %
Neutro Abs: 4.5 K/uL (ref 1.7–7.7)
Neutrophils Relative %: 63 %
Platelets: 283 K/uL (ref 150–400)
RBC: 4.38 MIL/uL (ref 3.87–5.11)
RDW: 13.4 % (ref 11.5–15.5)
WBC: 7.1 K/uL (ref 4.0–10.5)
nRBC: 0 % (ref 0.0–0.2)

## 2024-01-29 LAB — HCG, SERUM, QUALITATIVE: Preg, Serum: NEGATIVE

## 2024-01-29 MED ORDER — KETOROLAC TROMETHAMINE 15 MG/ML IJ SOLN
15.0000 mg | Freq: Once | INTRAMUSCULAR | Status: AC
  Administered 2024-01-29: 15 mg via INTRAVENOUS
  Filled 2024-01-29: qty 1

## 2024-01-29 MED ORDER — HYDROMORPHONE HCL 1 MG/ML IJ SOLN
0.5000 mg | Freq: Once | INTRAMUSCULAR | Status: AC
  Administered 2024-01-29: 0.5 mg via INTRAVENOUS
  Filled 2024-01-29: qty 1

## 2024-01-29 MED ORDER — MORPHINE SULFATE (PF) 4 MG/ML IV SOLN
4.0000 mg | Freq: Once | INTRAVENOUS | Status: AC
  Administered 2024-01-29: 4 mg via INTRAVENOUS
  Filled 2024-01-29: qty 1

## 2024-01-29 MED ORDER — TAMSULOSIN HCL 0.4 MG PO CAPS: 0.4000 mg | ORAL_CAPSULE | Freq: Every day | ORAL | 0 refills | Status: AC

## 2024-01-29 MED ORDER — ONDANSETRON HCL 4 MG/2ML IJ SOLN
4.0000 mg | Freq: Once | INTRAMUSCULAR | Status: AC
  Administered 2024-01-29: 4 mg via INTRAVENOUS
  Filled 2024-01-29: qty 2

## 2024-01-29 MED ORDER — IOHEXOL 300 MG/ML  SOLN
100.0000 mL | Freq: Once | INTRAMUSCULAR | Status: AC | PRN
  Administered 2024-01-29: 100 mL via INTRAVENOUS

## 2024-01-29 MED ORDER — OXYCODONE HCL 5 MG PO TABS: 5.0000 mg | ORAL_TABLET | ORAL | 0 refills | Status: AC | PRN

## 2024-01-29 MED ORDER — ONDANSETRON 4 MG PO TBDP: 4.0000 mg | ORAL_TABLET | Freq: Three times a day (TID) | ORAL | 0 refills | Status: AC | PRN

## 2024-01-29 MED ORDER — SODIUM CHLORIDE 0.9 % IV BOLUS
1000.0000 mL | Freq: Once | INTRAVENOUS | Status: AC
  Administered 2024-01-29: 1000 mL via INTRAVENOUS

## 2024-01-29 NOTE — Discharge Instructions (Signed)
 Please call to schedule appointment with urology.  They will see you for kidney stone follow-up.  Would recommend 1000 mg of Tylenol  every 8 hours.  You can take 600 mg of ibuprofen  every 8 hours but make sure you take this with food.  Take oxycodone  for breakthrough pain.  Take tamsulosin as prescribed.  Take Zofran  as needed for nausea and vomiting.  Return to emergency room with new or worsening symptoms including new or worsening symptoms including fever.

## 2024-01-29 NOTE — ED Triage Notes (Signed)
 Pt reports lower left back pain and lower abd pressure, ongoing for a few months but is feeling worse now. Doesn't hurt to pee, lots of pressure. Has appt with urology in December. Past UA cultures have come back negative.

## 2024-01-29 NOTE — ED Provider Notes (Signed)
 [place] EMERGENCY DEPARTMENT AT [facility] Provider Note   CSN: [id number] Arrival date & time: 01/29/24  9359     Patient presents with: Back Pain   Miranda Morales is a 43 y.o. female with past medical history of adenomyosis and degenerative disc disease presents to emergency room with complaint of left lower abdominal pain/pressure and urinary frequency for approximately a month and a half.  She reports this pain is mild and aching but what caused her to come in today as she developed new left sided back pain this morning that woke her up out of her sleep.  Flank pain is constant in severity comes and goes.  No associated nausea vomiting diarrhea or fever.    Back Pain      Prior to Admission medications   Medication Sig Start Date End Date Taking? Authorizing Provider  acetaZOLAMIDE (DIAMOX) 250 MG tablet Take by mouth 2 (two) times daily.    [provider]  Ascorbic Acid (VITAMIN C PO) Vitamin C    [provider]  BIOTIN PO Take by mouth.    [provider]  celecoxib (CELEBREX) 200 MG capsule Take 200 mg by mouth daily. Patient not taking: Reported on 07/21/2023 09/07/19   [provider]  cetirizine (ZYRTEC) 10 MG tablet Take 10 mg by mouth daily. Patient not taking: Reported on 07/21/2023    [provider]  Cholecalciferol (VITAMIN D-3 PO) Take by mouth.    [provider]  famotidine (PEPCID) 20 MG tablet Take 20 mg by mouth. 07/12/23   [provider]  FLUoxetine (PROZAC) 40 MG capsule Take 1 tablet by mouth daily. 02/11/20   [provider]  Glucosamine HCl (GLUCOSAMINE PO) Take by mouth.    [provider]  hepatitis b vaccine (HEPLISAV-B ) injection Inject into the muscle. 10/27/23   Luiz Channel, MD  HYDROCORTISONE ACE, RECTAL, 30 MG SUPP Place 1 suppository rectally as needed. Patient not taking: Reported on 07/21/2023 11/29/19   [provider]  levocetirizine  (XYZAL) 5 MG tablet Take 5 mg by mouth.    [provider]  meclizine (ANTIVERT) 12.5 MG tablet Take by mouth. 05/05/23   [provider]  methocarbamol (ROBAXIN) 500 MG tablet Take by mouth. Patient not taking: Reported on 07/21/2023 02/15/20   [provider]  Multiple Vitamin (MULTIVITAMIN PO) Vitamins/Minerals    [provider]  Multiple Vitamin (MULTIVITAMIN) capsule Take 1 capsule by mouth daily.    [provider]  Multiple Vitamins-Minerals (ZINC PO) Take by mouth.    [provider]  norethindrone  (MICRONOR ) 0.35 MG tablet TAKE 1 TABLET BY MOUTH EVERY DAY Patient not taking: Reported on 07/21/2023 02/17/21   Cathlyn JAYSON Nikki Bobie FORBES, MD  ondansetron  (ZOFRAN ) 8 MG tablet Take 8 mg by mouth every 8 (eight) hours as needed. 07/12/23   [provider]  rizatriptan (MAXALT) 10 MG tablet Take by mouth.    [provider]  Semaglutide -Weight Management (WEGOVY ) 0.25 MG/0.5ML SOAJ Inject 0.25 mg into the skin once a week. 03/15/23       Allergies: Wound dressing adhesive    Review of Systems  Musculoskeletal:  Positive for back pain.    Updated Vital Signs BP 130/88 (BP Location: Right Arm)   Pulse (!) 57   Temp 97.6 F (36.4 C) (Oral)   Resp 18   SpO2 98%   Physical Exam Vitals and nursing note reviewed.  Constitutional:      General: She is  not in acute distress.    Appearance: She is not toxic-appearing.  HENT:     Head: Normocephalic and atraumatic.  Eyes:     General: No scleral icterus.    Conjunctiva/sclera: Conjunctivae normal.  Cardiovascular:     Rate and Rhythm: Normal rate and regular rhythm.     Pulses: Normal pulses.     Heart sounds: Normal heart sounds.  Pulmonary:     Effort: Pulmonary effort is normal. No respiratory distress.     Breath sounds: Normal breath sounds.  Abdominal:     General: Abdomen is flat. Bowel sounds are normal.     Palpations: Abdomen is soft. There is no mass.      Tenderness: There is abdominal tenderness. There is left CVA tenderness. There is no right CVA tenderness.     Comments: Mild suprapubic TTP  Musculoskeletal:     Comments: Lumbar midline tenderness step-off or deformity.  No reproducible tenderness of low back.  Skin:    General: Skin is warm and dry.     Findings: No lesion.  Neurological:     General: No focal deficit present.     Mental Status: She is alert and oriented to person, place, and time. Mental status is at baseline.     (all labs ordered are listed, but only abnormal results are displayed) Labs Reviewed  URINALYSIS, ROUTINE W REFLEX MICROSCOPIC - Abnormal; Notable for the following components:      Result Value   APPearance HAZY (*)    Specific Gravity, Urine 1.033 (*)    Ketones, ur 5 (*)    All other components within normal limits  COMPREHENSIVE METABOLIC PANEL WITH GFR  LIPASE, BLOOD  CBC WITH DIFFERENTIAL/PLATELET  HCG, SERUM, QUALITATIVE    EKG: None  Radiology: CT ABDOMEN PELVIS W CONTRAST Result Date: 01/29/2024 EXAM: CT ABDOMEN AND PELVIS WITH CONTRAST 01/29/2024 08:56:51 AM TECHNIQUE: CT of the abdomen and pelvis was performed with the administration of 100 mL of iohexol  (OMNIPAQUE ) 300 MG/ML solution. Multiplanar reformatted images are provided for review. Automated exposure control, iterative reconstruction, and/or weight-based adjustment of the mA/kV was utilized to reduce the radiation dose to as low as reasonably achievable. COMPARISON: None available. CLINICAL HISTORY: Abdominal/flank pain, stone suspected; UTI, recurrent/complicated (Female) FINDINGS: LOWER CHEST: No acute abnormality. LIVER: The liver is unremarkable. GALLBLADDER AND BILE DUCTS: Status post cholecystectomy. Mild fusiform dilatation of the common bile duct measures up to 8 mm. SPLEEN: The spleen is within normal limits in size and appearance. PANCREAS: The pancreas is normal in size and contour without focal lesion or ductal  dilatation. ADRENAL GLANDS: Normal size and morphology bilaterally. No nodule, thickening, or hemorrhage. No periadrenal stranding. KIDNEYS, URETERS AND BLADDER: There is asymmetric left-sided nephromegaly, mild perinephric haziness, and moderate hydronephrosis. Left-sided hydroureter is identified. There is streak artifact from a left hip arthroplasty device significantly diminishing exam detail at the level of the urinary bladder. Within this limitation, a stone is suspected within the distal left ureter measuring 5 mm (coronal image 74/10 and axial image 78/4). No stones are identified in the right kidney or ureter. No right-sided hydronephrosis or hydroureter. No perinephric or periureteral stranding on the right. GI AND BOWEL: Stomach demonstrates no acute abnormality. The appendix is visualized and normal in caliber, without wall thickening, periappendiceal inflammation, or fluid. There is no bowel obstruction. PERITONEUM AND RETROPERITONEUM: No ascites. No free air. VASCULATURE: Aorta is normal in caliber. LYMPH NODES: No lymphadenopathy. REPRODUCTIVE ORGANS: Uterus and adnexal structures are unremarkable.  BONES AND SOFT TISSUES: Previous left hip arthroplasty. Streak artifact from the left hip arthroplasty device significantly diminishes exam detail at the level of the urinary bladder. No acute osseous abnormality. No focal soft tissue abnormality. IMPRESSION: 1. Left-sided hydroureteronephrosis with asymmetric nephromegaly and mild perinephric stranding, likely secondary to a suspected 5 mm distal left ureteral stone, though evaluation is limited by streak artifact from left hip arthroplasty hardware. Electronically signed by: Waddell Calk MD 01/29/2024 09:17 AM EST RP Workstation: [id number]     Procedures   Medications Ordered in the ED  HYDROmorphone (DILAUDID) injection 0.5 mg (has no administration in time range)  ketorolac  (TORADOL ) 15 MG/ML injection 15 mg (has no administration in time  range)  sodium chloride  0.9 % bolus 1,000 mL (1,000 mLs Intravenous New Bag/Given 01/29/24 0746)  ondansetron  (ZOFRAN ) injection 4 mg (4 mg Intravenous Given 01/29/24 0750)  morphine (PF) 4 MG/ML injection 4 mg (4 mg Intravenous Given 01/29/24 0754)  iohexol  (OMNIPAQUE ) 300 MG/ML solution 100 mL (100 mLs Intravenous Contrast Given 01/29/24 0843)    Clinical Course as of 01/29/24 0949  Sun Jan 29, 2024  0946 Reassessed, still uncomfortable, will add repeat pain medication. [JB]    Clinical Course User Index [JB] Kora Groom, Warren SAILOR, PA-C                                 Medical Decision Making Amount and/or Complexity of Data Reviewed Labs: ordered. Radiology: ordered.  Risk Prescription drug management.   This patient presents to the ED for concern of abdominal pain, this involves an extensive number of treatment options, and is a complaint that carries with it a high risk of complications and morbidity.  The differential diagnosis includes cholecystitis, AAA, appendicitis, renal stone, UTI or pyelonephritis, mass, SBO   Co morbidities that complicate the patient evaluation  DDD Adenomyosis    Additional history obtained:  Additional history obtained from reviewed patient's recent primary care office visits including 11/30/2023 and 01/16/2024 in which patient had nitrite positive urine and started on Bactrim, was referred to urogynecology for persistent symptoms   Lab Tests:  I personally interpreted labs.  The pertinent results include:   CBC with no leukocytosis and no anemia CMP normal creatinine and GFR no electrolyte abnormality UA negative for nitrites and negative for leukocytes   Imaging Studies ordered:  I ordered imaging studies including CT scan of abdomen pelvis with contrast I independently visualized and interpreted imaging which showed left hydroureteronephrosis with mild perinephric stranding likely secondary to 5 mm distal left ureter stone I agree with  the radiologist interpretation   Cardiac Monitoring: / EKG:  The patient was maintained on a cardiac monitor.      Problem List / ED Course / Critical interventions / Medication management  Reports to emergency room with persistent suprapubic pain that has been ongoing for about a month and a half.  She does have urinary frequency but no dysuria.  She is also not had any fever.  She reports that this morning she started having left sided back pain that is quite severe.  She does have left-sided CVA tenderness but no tenderness to palpation of low back or over lumbar spine.  She does have mild suprapubic tenderness but no rebound tenderness and no peritoneal sign.  Patient is hemodynamically stable and well-appearing.  CBC shows no leukocytosis and she is not meeting SIRS or sepsis criteria.  Will obtain CT scan  of abdomen pelvis to rule out intra-abdominal pathology.  Will treat with analgesics and reassess. CT scan shows kidney stone.  This is consistent with patient's ongoing symptoms.  She has no leukocytosis, she is afebrile.  Normal kidney function.  UA unremarkable, no urinary tract infection. I ordered medication including morphine, Zofran , normal saline Reevaluation of the patient after these medicines showed that the patient improved I have reviewed the patients home medicines and have made adjustments as needed With improved symptoms, tolerating oral intake, hemodynamically stable feel patient stable for discharge with close outpatient follow-up.  Will give urology follow-up.      Final diagnoses:  None    ED Discharge Orders     None          Shermon Warren SAILOR, PA-C 01/29/24 1147    Suzette Pac, MD 01/31/24 1051
